# Patient Record
Sex: Female | Born: 1955 | Race: White | Hispanic: No | Smoking: Never smoker
Health system: Southern US, Community
[De-identification: ages and names within clinical notes are randomized; demographics above are authoritative.]

## PROBLEM LIST (undated history)

## (undated) DIAGNOSIS — B1920 Unspecified viral hepatitis C without hepatic coma: Secondary | ICD-10-CM

## (undated) DIAGNOSIS — G709 Myoneural disorder, unspecified: Secondary | ICD-10-CM

## (undated) DIAGNOSIS — J189 Pneumonia, unspecified organism: Secondary | ICD-10-CM

## (undated) DIAGNOSIS — F329 Major depressive disorder, single episode, unspecified: Secondary | ICD-10-CM

## (undated) DIAGNOSIS — B029 Zoster without complications: Secondary | ICD-10-CM

## (undated) DIAGNOSIS — F319 Bipolar disorder, unspecified: Secondary | ICD-10-CM

## (undated) DIAGNOSIS — K8020 Calculus of gallbladder without cholecystitis without obstruction: Secondary | ICD-10-CM

## (undated) DIAGNOSIS — F32A Depression, unspecified: Secondary | ICD-10-CM

## (undated) DIAGNOSIS — G2581 Restless legs syndrome: Secondary | ICD-10-CM

## (undated) DIAGNOSIS — Z5189 Encounter for other specified aftercare: Secondary | ICD-10-CM

## (undated) DIAGNOSIS — I10 Essential (primary) hypertension: Secondary | ICD-10-CM

## (undated) HISTORY — PX: BLADDER SURGERY: SHX569

## (undated) HISTORY — PX: COLON SURGERY: SHX602

## (undated) HISTORY — PX: ABDOMINAL HYSTERECTOMY: SHX81

## (undated) HISTORY — DX: Pneumonia, unspecified organism: J18.9

## (undated) HISTORY — PX: TUBAL LIGATION: SHX77

## (undated) HISTORY — PX: CHOLECYSTECTOMY: SHX55

## (undated) HISTORY — DX: Unspecified viral hepatitis C without hepatic coma: B19.20

## (undated) HISTORY — PX: HERNIA REPAIR: SHX51

## (undated) HISTORY — DX: Major depressive disorder, single episode, unspecified: F32.9

## (undated) HISTORY — DX: Encounter for other specified aftercare: Z51.89

## (undated) HISTORY — DX: Calculus of gallbladder without cholecystitis without obstruction: K80.20

## (undated) HISTORY — DX: Bipolar disorder, unspecified: F31.9

## (undated) HISTORY — DX: Essential (primary) hypertension: I10

## (undated) HISTORY — DX: Depression, unspecified: F32.A

## (undated) HISTORY — DX: Zoster without complications: B02.9

## (undated) HISTORY — DX: Restless legs syndrome: G25.81

## (undated) HISTORY — DX: Myoneural disorder, unspecified: G70.9

---

## 2009-08-08 DIAGNOSIS — J189 Pneumonia, unspecified organism: Secondary | ICD-10-CM

## 2009-08-08 HISTORY — DX: Pneumonia, unspecified organism: J18.9

## 2013-06-08 DIAGNOSIS — B029 Zoster without complications: Secondary | ICD-10-CM

## 2013-06-08 HISTORY — DX: Zoster without complications: B02.9

## 2013-12-09 DIAGNOSIS — I10 Essential (primary) hypertension: Secondary | ICD-10-CM | POA: Diagnosis not present

## 2013-12-09 DIAGNOSIS — R1011 Right upper quadrant pain: Secondary | ICD-10-CM | POA: Diagnosis not present

## 2013-12-09 DIAGNOSIS — F319 Bipolar disorder, unspecified: Secondary | ICD-10-CM | POA: Diagnosis not present

## 2014-01-11 DIAGNOSIS — R1011 Right upper quadrant pain: Secondary | ICD-10-CM | POA: Diagnosis not present

## 2014-01-19 DIAGNOSIS — F319 Bipolar disorder, unspecified: Secondary | ICD-10-CM | POA: Diagnosis not present

## 2014-01-19 DIAGNOSIS — I10 Essential (primary) hypertension: Secondary | ICD-10-CM | POA: Diagnosis not present

## 2014-01-19 DIAGNOSIS — E039 Hypothyroidism, unspecified: Secondary | ICD-10-CM | POA: Diagnosis not present

## 2014-02-18 DIAGNOSIS — I10 Essential (primary) hypertension: Secondary | ICD-10-CM | POA: Diagnosis not present

## 2014-02-18 DIAGNOSIS — R635 Abnormal weight gain: Secondary | ICD-10-CM | POA: Diagnosis not present

## 2014-05-29 DIAGNOSIS — R209 Unspecified disturbances of skin sensation: Secondary | ICD-10-CM | POA: Diagnosis not present

## 2014-05-29 DIAGNOSIS — J029 Acute pharyngitis, unspecified: Secondary | ICD-10-CM | POA: Diagnosis not present

## 2014-05-29 DIAGNOSIS — R109 Unspecified abdominal pain: Secondary | ICD-10-CM | POA: Diagnosis not present

## 2014-05-29 DIAGNOSIS — J189 Pneumonia, unspecified organism: Secondary | ICD-10-CM | POA: Diagnosis not present

## 2014-05-29 DIAGNOSIS — K7689 Other specified diseases of liver: Secondary | ICD-10-CM | POA: Diagnosis not present

## 2014-05-29 DIAGNOSIS — R42 Dizziness and giddiness: Secondary | ICD-10-CM | POA: Diagnosis not present

## 2014-05-29 DIAGNOSIS — R262 Difficulty in walking, not elsewhere classified: Secondary | ICD-10-CM | POA: Diagnosis not present

## 2014-05-29 DIAGNOSIS — R509 Fever, unspecified: Secondary | ICD-10-CM | POA: Diagnosis not present

## 2014-05-29 DIAGNOSIS — I10 Essential (primary) hypertension: Secondary | ICD-10-CM | POA: Diagnosis not present

## 2014-05-29 DIAGNOSIS — R5381 Other malaise: Secondary | ICD-10-CM | POA: Diagnosis not present

## 2014-05-29 DIAGNOSIS — F411 Generalized anxiety disorder: Secondary | ICD-10-CM | POA: Diagnosis not present

## 2014-05-29 DIAGNOSIS — K449 Diaphragmatic hernia without obstruction or gangrene: Secondary | ICD-10-CM | POA: Diagnosis not present

## 2014-05-29 DIAGNOSIS — R079 Chest pain, unspecified: Secondary | ICD-10-CM | POA: Diagnosis not present

## 2014-05-29 DIAGNOSIS — R51 Headache: Secondary | ICD-10-CM | POA: Diagnosis not present

## 2014-05-29 DIAGNOSIS — R300 Dysuria: Secondary | ICD-10-CM | POA: Diagnosis not present

## 2014-05-29 DIAGNOSIS — R042 Hemoptysis: Secondary | ICD-10-CM | POA: Diagnosis not present

## 2014-05-29 DIAGNOSIS — R0602 Shortness of breath: Secondary | ICD-10-CM | POA: Diagnosis not present

## 2014-08-11 DIAGNOSIS — F319 Bipolar disorder, unspecified: Secondary | ICD-10-CM | POA: Diagnosis not present

## 2014-08-11 DIAGNOSIS — I10 Essential (primary) hypertension: Secondary | ICD-10-CM | POA: Diagnosis not present

## 2014-08-11 DIAGNOSIS — R635 Abnormal weight gain: Secondary | ICD-10-CM | POA: Diagnosis not present

## 2014-08-11 DIAGNOSIS — M6080 Other myositis, unspecified site: Secondary | ICD-10-CM | POA: Diagnosis not present

## 2014-09-20 DIAGNOSIS — I10 Essential (primary) hypertension: Secondary | ICD-10-CM | POA: Diagnosis not present

## 2014-09-20 DIAGNOSIS — F319 Bipolar disorder, unspecified: Secondary | ICD-10-CM | POA: Diagnosis not present

## 2014-12-28 DIAGNOSIS — I10 Essential (primary) hypertension: Secondary | ICD-10-CM | POA: Diagnosis not present

## 2014-12-28 DIAGNOSIS — F319 Bipolar disorder, unspecified: Secondary | ICD-10-CM | POA: Diagnosis not present

## 2015-03-21 DIAGNOSIS — F319 Bipolar disorder, unspecified: Secondary | ICD-10-CM | POA: Diagnosis not present

## 2015-03-21 DIAGNOSIS — R079 Chest pain, unspecified: Secondary | ICD-10-CM | POA: Diagnosis not present

## 2015-08-01 ENCOUNTER — Ambulatory Visit (INDEPENDENT_AMBULATORY_CARE_PROVIDER_SITE_OTHER): Payer: Medicare Other | Admitting: Physician Assistant

## 2015-08-01 ENCOUNTER — Ambulatory Visit (INDEPENDENT_AMBULATORY_CARE_PROVIDER_SITE_OTHER): Payer: Medicare Other

## 2015-08-01 VITALS — BP 122/80 | HR 72 | Temp 98.6°F | Resp 18 | Ht 62.0 in | Wt 217.8 lb

## 2015-08-01 DIAGNOSIS — Z23 Encounter for immunization: Secondary | ICD-10-CM

## 2015-08-01 DIAGNOSIS — R1032 Left lower quadrant pain: Secondary | ICD-10-CM

## 2015-08-01 LAB — POCT URINALYSIS DIP (MANUAL ENTRY)
Bilirubin, UA: NEGATIVE
Blood, UA: NEGATIVE
Glucose, UA: NEGATIVE
Ketones, POC UA: NEGATIVE
LEUKOCYTES UA: NEGATIVE
Nitrite, UA: NEGATIVE
PROTEIN UA: NEGATIVE
SPEC GRAV UA: 1.025
UROBILINOGEN UA: 0.2
pH, UA: 5

## 2015-08-01 LAB — POCT CBC
Granulocyte percent: 62.3 %G (ref 37–80)
HCT, POC: 40.9 % (ref 37.7–47.9)
Hemoglobin: 13.8 g/dL (ref 12.2–16.2)
LYMPH, POC: 2.3 (ref 0.6–3.4)
MCH, POC: 29.2 pg (ref 27–31.2)
MCHC: 33.7 g/dL (ref 31.8–35.4)
MCV: 86.8 fL (ref 80–97)
MID (cbc): 0.4 (ref 0–0.9)
MPV: 7.9 fL (ref 0–99.8)
PLATELET COUNT, POC: 271 10*3/uL (ref 142–424)
POC Granulocyte: 4.5 (ref 2–6.9)
POC LYMPH %: 31.7 % (ref 10–50)
POC MID %: 6 %M (ref 0–12)
RBC: 4.71 M/uL (ref 4.04–5.48)
RDW, POC: 13.8 %
WBC: 7.2 10*3/uL (ref 4.6–10.2)

## 2015-08-01 LAB — POC MICROSCOPIC URINALYSIS (UMFC)

## 2015-08-01 NOTE — Progress Notes (Signed)
Patient ID: Catherine Farrell, female    DOB: 13-Oct-1955, 58 y.o.   MRN: [id number]  PCP: Pcp Not In System  Subjective:   Chief Complaint  Patient presents with  . Groin Pain    pain started about 2 weeks after sexual intercourse. on left side.     HPI Presents for evaluation of LLQ abdominal pain x 2 weeks.   The pain started during intercourse on October 15th, prior to which time she had not been sexually active for 9 months. The pain is sharp and stabbing in nature and initially subsided several hours after intercourse with aspirin and a heating pad. The pain returned 2-3 days later and has gradually worsened since that time, particularly with straining.   Yesterday, she remained in bed all day d/t dizziness and nausea.   She endorses some increased urinary frequency and urgency, but denies hematuria, vaginal discharge, itching, odor, fevers, chills, vomiting, or diarrhea.   She reports recently starting a new diet plan to lose weight and has had some constipation as a result. She has tried to increase her water intake, however she has not had a BM in 4 days.   Of note, she had a partial hysterectomy in early 2000 for fibroids. She is post-menopausal x 5 years.   No other prior abdominal surgeries.   Patient moved to this area 3 months ago and is looking to establish care with a new PCP. She would like to get her flu vaccination today as well.   No other concerns on today's visit.     Review of Systems Constitutional: Positive for appetite change (not as hungry). Negative for fever, chills and fatigue.  Gastrointestinal: Positive for nausea, abdominal pain (LLQ) and constipation. Negative for vomiting, diarrhea and blood in stool.  Genitourinary: Positive for urgency, frequency and dyspareunia. Negative for dysuria, hematuria, flank pain, decreased urine volume, vaginal bleeding, vaginal discharge, difficulty urinating, vaginal pain and pelvic pain.  Musculoskeletal:  Negative for back pain.  Neurological: Positive for dizziness. Negative for headaches.      There are no active problems to display for this patient.    Prior to Admission medications   Medication Sig Start Date End Date Taking? Authorizing Provider  amLODipine (NORVASC) 10 MG tablet Take 10 mg by mouth daily.   Yes Historical Provider, MD  cyclobenzaprine (FLEXERIL) 10 MG tablet Take 10 mg by mouth 3 (three) times daily as needed for muscle spasms.   Yes Historical Provider, MD  diazepam (VALIUM) 10 MG tablet Take 10 mg by mouth every 6 (six) hours as needed for anxiety.   Yes Historical Provider, MD  lisinopril-hydrochlorothiazide (PRINZIDE,ZESTORETIC) 10-12.5 MG tablet Take 1 tablet by mouth daily.   Yes Historical Provider, MD  metoprolol succinate (TOPROL-XL) 100 MG 24 hr tablet Take 100 mg by mouth daily. Take with or immediately following a meal.   Yes Historical Provider, MD  QUEtiapine (SEROQUEL) 200 MG tablet Take 200 mg by mouth at bedtime.   Yes Historical Provider, MD     No Known Allergies     Objective:  Physical Exam  Constitutional: She is oriented to person, place, and time. Vital signs are normal. She appears well-developed and well-nourished. She is active and cooperative. No distress.  BP 122/80 mmHg  Pulse 72  Temp(Src) 98.6 F (37 C) (Oral)  Resp 18  Ht  (1.575 m)  Wt 217 lb 12.8 oz (98.793 kg)  BMI 39.83 kg/m2  SpO2 97%  HENT:  Head: Normocephalic and  atraumatic.  Right Ear: Hearing normal.  Left Ear: Hearing normal.  Eyes: Conjunctivae are normal. No scleral icterus.  Neck: Normal range of motion. Neck supple. No thyromegaly present.  Cardiovascular: Normal rate, regular rhythm and normal heart sounds.   Pulses:      Radial pulses are 2+ on the right side, and 2+ on the left side.  Pulmonary/Chest: Effort normal and breath sounds normal.  Abdominal: Soft. Normal appearance and bowel sounds are normal. She exhibits no shifting dullness, no  distension, no pulsatile liver, no fluid wave, no abdominal bruit, no ascites, no pulsatile midline mass and no mass. There is no hepatosplenomegaly. There is tenderness (mild) in the left lower quadrant. There is no rigidity, no rebound, no guarding, no CVA tenderness, no tenderness at McBurney's point and negative Murphy's sign. No hernia. Hernia confirmed negative in the right inguinal area and confirmed negative in the left inguinal area.  Genitourinary: Vagina normal. Pelvic exam was performed with patient supine. No labial fusion. There is no rash, tenderness, lesion or injury on the right labia. There is no rash, tenderness, lesion or injury on the left labia. Right adnexum displays no mass, no tenderness and no fullness. Left adnexum displays no mass, no tenderness and no fullness.  Cervix is surgically absent.  Lymphadenopathy:       Head (right side): No tonsillar, no preauricular, no posterior auricular and no occipital adenopathy present.       Head (left side): No tonsillar, no preauricular, no posterior auricular and no occipital adenopathy present.    She has no cervical adenopathy.       Right: No inguinal and no supraclavicular adenopathy present.       Left: No inguinal and no supraclavicular adenopathy present.  Neurological: She is alert and oriented to person, place, and time. No sensory deficit.  Skin: Skin is warm, dry and intact. No rash noted. No cyanosis or erythema. Nails show no clubbing.  Psychiatric: She has a normal mood and affect.       Acute Abdominal Series: [facility] reading (PRIMARY) by  Dr. Perrin MalteseGuest. Non-specific bowel gas pattern, with large stool burden.No Free air, ileus, mass.  Results for orders placed or performed in visit on 08/01/15  POCT CBC  Result Value Ref Range   WBC 7.2 4.6 - 10.2 K/uL   Lymph, poc 2.3 0.6 - 3.4   POC LYMPH PERCENT 31.7 10 - 50 %L   MID (cbc) 0.4 0 - 0.9   POC MID % 6.0 0 - 12 %M   POC Granulocyte 4.5 2 - 6.9   Granulocyte  percent 62.3 37 - 80 %G   RBC 4.71 4.04 - 5.48 M/uL   Hemoglobin 13.8 12.2 - 16.2 g/dL   HCT, POC 16.140.9 09.637.7 - 47.9 %   MCV 86.8 80 - 97 fL   MCH, POC 29.2 27 - 31.2 pg   MCHC 33.7 31.8 - 35.4 g/dL   RDW, POC 04.513.8 %   Platelet Count, POC 271 142 - 424 K/uL   MPV 7.9 0 - 99.8 fL  POCT urinalysis dipstick  Result Value Ref Range   Color, UA yellow yellow   Clarity, UA clear clear   Glucose, UA negative negative   Bilirubin, UA negative negative   Ketones, POC UA negative negative   Spec Grav, UA 1.025    Blood, UA negative negative   pH, UA 5.0    Protein Ur, POC negative negative   Urobilinogen, UA 0.2  Nitrite, UA Negative Negative   Leukocytes, UA Negative Negative  POCT Microscopic Urinalysis ([facility])  Result Value Ref Range   WBC,UR,HPF,POC None None WBC/hpf   RBC,UR,HPF,POC None None RBC/hpf   Bacteria None None, Too numerous to count   Mucus Present (A) Absent   Epithelial Cells, UR Per Microscopy Moderate (A) None, Too numerous to count cells/hpf   Casts hyaline          Assessment & Plan:   1. LLQ abdominal pain Suspect constipation. No evidence of infection. Increase fluids, fiber, physical activity. Miralax OTC. RTC if symptoms worsen/persist. - POCT CBC - POCT urinalysis dipstick - POCT Microscopic Urinalysis ([facility]) - DG Abd Acute W/Chest  2. Need for influenza vaccination - Flu Vaccine QUAD 36+ mos IM   Fernande Bras, PA-C Physician Assistant-Certified [facility]

## 2015-08-01 NOTE — Patient Instructions (Signed)
I suspect that the stool in the colon os the cause of your pain. Be sure you are drinking 64 ounces of water daily and getting plenty of fiber in your diet.  Use OTC Miralax 1-2 times daily, until you have good results and your pain resolves. If you develop loose stools, reduce the frequency of Miralax. If your pain worsens or does not improve, return for additional evaluation.

## 2015-08-01 NOTE — Progress Notes (Signed)
Subjective:    Patient ID: Catherine Farrell, female    DOB: 08/08/1956, 59 y.o.   MRN: [id number]  Chief Complaint  Patient presents with  . Groin Pain    pain started about 2 weeks after sexual intercourse. on left side.    HPI Patient presents today for evaluation of LLQ abdominal pain x 2 weeks. The pain started with intercourse on October 15th, prior to which time she had not been sexually active for 9 months. The pain is sharp and stabbing in nature and initially subsided several hours after intercourse with aspirin and a heating pad. The pain returned 2-3 days later and has gradually worsened since that time, particularly with straining. Yesterday, she remained in bed all day d/t dizziness and nausea. She endorses some increased urinary frequency and urgency, but denies hematuria, vaginal discharge, itching, odor, fevers, chills, vomiting, or diarrhea.   She reports recently starting a new diet plan to lose weight and has had some constipation as a result. She has tried to increase her water intake, however she has not had a BM in 4 days.    Of note, she had a partial hysterectomy in early 2000 for fibroids. She is post-menopausal x 5 years. No other prior abdominal surgeries.   Patient moved to this area 3 months ago and is looking to establish care with a new PCP. She would like to get her flu vaccination today as well.    No other concerns on today's visit.   Review of Systems  Constitutional: Positive for appetite change (not as hungry). Negative for fever, chills and fatigue.  Gastrointestinal: Positive for nausea, abdominal pain (LLQ) and constipation. Negative for vomiting, diarrhea and blood in stool.  Genitourinary: Positive for urgency, frequency and dyspareunia. Negative for dysuria, hematuria, flank pain, decreased urine volume, vaginal bleeding, vaginal discharge, difficulty urinating, vaginal pain and pelvic pain.  Musculoskeletal: Negative for back pain.  Neurological:  Positive for dizziness. Negative for headaches.   There are no active problems to display for this patient.  Family History  Problem Relation Age of Onset  . Hyperlipidemia Mother   . Hypertension Mother   . Mental illness Mother   . Hyperlipidemia Father   . Mental illness Father   . Hyperlipidemia Sister   . Mental illness Sister   . Diabetes Sister   . Hyperlipidemia Brother   . Mental illness Brother   . Mental illness Brother   . Hyperlipidemia Brother    Social History   Social History  . Marital Status: Widowed    Spouse Name: N/A  . Number of Children: N/A  . Years of Education: N/A   Occupational History  . Not on file.   Social History Main Topics  . Smoking status: Never Smoker   . Smokeless tobacco: Not on file  . Alcohol Use: No  . Drug Use: No  . Sexual Activity: Not on file   Other Topics Concern  . Not on file   Social History Narrative  . No narrative on file   Prior to Admission medications   Medication Sig Start Date End Date Taking? Authorizing Provider  amLODipine (NORVASC) 10 MG tablet Take 10 mg by mouth daily.   Yes Historical Provider, MD  cyclobenzaprine (FLEXERIL) 10 MG tablet Take 10 mg by mouth 3 (three) times daily as needed for muscle spasms.   Yes Historical Provider, MD  diazepam (VALIUM) 10 MG tablet Take 10 mg by mouth every 6 (six) hours as needed for  anxiety.   Yes Historical Provider, MD  lisinopril-hydrochlorothiazide (PRINZIDE,ZESTORETIC) 10-12.5 MG tablet Take 1 tablet by mouth daily.   Yes Historical Provider, MD  metoprolol succinate (TOPROL-XL) 100 MG 24 hr tablet Take 100 mg by mouth daily. Take with or immediately following a meal.   Yes Historical Provider, MD  QUEtiapine (SEROQUEL) 200 MG tablet Take 200 mg by mouth at bedtime.   Yes Historical Provider, MD   No Known Allergies    Objective:   Physical Exam  Constitutional: She is oriented to person, place, and time. She appears well-developed and well-nourished.  No distress.  BP 122/80 mmHg  Pulse 72  Temp(Src) 98.6 F (37 C) (Oral)  Resp 18  Ht  (1.575 m)  Wt 217 lb 12.8 oz (98.793 kg)  BMI 39.83 kg/m2  SpO2 97%  HENT:  Head: Normocephalic and atraumatic.  Eyes: EOM are normal. No scleral icterus.  Neck: Neck supple. No JVD present. No thyromegaly present.  Cardiovascular: Normal rate, regular rhythm, normal heart sounds and intact distal pulses.  Exam reveals no gallop and no friction rub.   No murmur heard. Pulmonary/Chest: Effort normal and breath sounds normal. No respiratory distress. She has no wheezes. She exhibits no tenderness.  Abdominal: Soft. Bowel sounds are normal. She exhibits no distension and no mass. There is tenderness (LLQ, mild). There is no rebound and no guarding.  Genitourinary: Vagina normal and uterus normal. No vaginal discharge found.  No cervix, consistent with partial hysterectomy. No adnexal fullness or tenderness. No blood or discharge. No masses appreciated.   Lymphadenopathy:    She has no cervical adenopathy.  Neurological: She is alert and oriented to person, place, and time.  Skin: Skin is warm and dry. No rash noted. She is not diaphoretic. No erythema.  Psychiatric: She has a normal mood and affect. Her behavior is normal. Judgment and thought content normal.   Results for orders placed or performed in visit on 08/01/15  POCT CBC  Result Value Ref Range   WBC 7.2 4.6 - 10.2 K/uL   Lymph, poc 2.3 0.6 - 3.4   POC LYMPH PERCENT 31.7 10 - 50 %L   MID (cbc) 0.4 0 - 0.9   POC MID % 6.0 0 - 12 %M   POC Granulocyte 4.5 2 - 6.9   Granulocyte percent 62.3 37 - 80 %G   RBC 4.71 4.04 - 5.48 M/uL   Hemoglobin 13.8 12.2 - 16.2 g/dL   HCT, POC 16.1 09.6 - 47.9 %   MCV 86.8 80 - 97 fL   MCH, POC 29.2 27 - 31.2 pg   MCHC 33.7 31.8 - 35.4 g/dL   RDW, POC 04.5 %   Platelet Count, POC 271 142 - 424 K/uL   MPV 7.9 0 - 99.8 fL  POCT urinalysis dipstick  Result Value Ref Range   Color, UA yellow yellow    Clarity, UA clear clear   Glucose, UA negative negative   Bilirubin, UA negative negative   Ketones, POC UA negative negative   Spec Grav, UA 1.025    Blood, UA negative negative   pH, UA 5.0    Protein Ur, POC negative negative   Urobilinogen, UA 0.2    Nitrite, UA Negative Negative   Leukocytes, UA Negative Negative  POCT Microscopic Urinalysis ([facility])  Result Value Ref Range   WBC,UR,HPF,POC None None WBC/hpf   RBC,UR,HPF,POC None None RBC/hpf   Bacteria None None, Too numerous to count   Mucus Present (A)  Absent   Epithelial Cells, UR Per Microscopy Moderate (A) None, Too numerous to count cells/hpf   Casts hyaline    Abd w/ Chest XR shows no free air, calcifications, or bony abnormalities. Heart and lungs appear normal. Significant stool burden and gas noted throughout the colon consistent with constipation.     Assessment & Plan:  1. LLQ abdominal pain - Normal labs and imaging studies suggest no infectious or structural etiology. Suspect this is d/t colon distention/stretching secondary to constipation. Encourage patient to drink plenty of fluids, take OTC Miralax 1-2x/day, exercise, and consume plenty of fiber to help facilitate bowel movements.  - If no improvement of symptoms in the next 7 days, return to clinic for re-evaluation. - POCT CBC - POCT urinalysis dipstick - POCT Microscopic Urinalysis ([facility]) - DG Abd Acute W/Chest  2. Need for influenza vaccination - Flu Vaccine QUAD 36+ mos IM

## 2015-08-03 ENCOUNTER — Encounter: Payer: Self-pay | Admitting: Physician Assistant

## 2015-09-22 ENCOUNTER — Other Ambulatory Visit: Payer: Self-pay

## 2015-09-22 ENCOUNTER — Ambulatory Visit (INDEPENDENT_AMBULATORY_CARE_PROVIDER_SITE_OTHER): Payer: Medicare Other | Admitting: Physician Assistant

## 2015-09-22 VITALS — BP 148/90 | HR 63 | Temp 98.6°F | Resp 18 | Ht 62.0 in | Wt 219.0 lb

## 2015-09-22 DIAGNOSIS — R1011 Right upper quadrant pain: Secondary | ICD-10-CM

## 2015-09-22 DIAGNOSIS — G8929 Other chronic pain: Secondary | ICD-10-CM | POA: Diagnosis not present

## 2015-09-22 DIAGNOSIS — G709 Myoneural disorder, unspecified: Secondary | ICD-10-CM | POA: Diagnosis not present

## 2015-09-22 DIAGNOSIS — R1013 Epigastric pain: Secondary | ICD-10-CM

## 2015-09-22 DIAGNOSIS — R110 Nausea: Secondary | ICD-10-CM

## 2015-09-22 DIAGNOSIS — B182 Chronic viral hepatitis C: Secondary | ICD-10-CM

## 2015-09-22 DIAGNOSIS — H9201 Otalgia, right ear: Secondary | ICD-10-CM

## 2015-09-22 DIAGNOSIS — F319 Bipolar disorder, unspecified: Secondary | ICD-10-CM

## 2015-09-22 DIAGNOSIS — I10 Essential (primary) hypertension: Secondary | ICD-10-CM | POA: Diagnosis not present

## 2015-09-22 DIAGNOSIS — G2581 Restless legs syndrome: Secondary | ICD-10-CM | POA: Diagnosis not present

## 2015-09-22 DIAGNOSIS — G509 Disorder of trigeminal nerve, unspecified: Secondary | ICD-10-CM | POA: Insufficient documentation

## 2015-09-22 DIAGNOSIS — Z1231 Encounter for screening mammogram for malignant neoplasm of breast: Secondary | ICD-10-CM | POA: Diagnosis not present

## 2015-09-22 MED ORDER — ESOMEPRAZOLE MAGNESIUM 40 MG PO PACK: 40.0000 mg | PACK | Freq: Every day | ORAL | Status: DC

## 2015-09-22 MED ORDER — DIAZEPAM 10 MG PO TABS: 10.0000 mg | ORAL_TABLET | Freq: Four times a day (QID) | ORAL | Status: DC | PRN

## 2015-09-22 MED ORDER — CYCLOBENZAPRINE HCL 10 MG PO TABS: 10.0000 mg | ORAL_TABLET | Freq: Three times a day (TID) | ORAL | Status: DC | PRN

## 2015-09-22 MED ORDER — METOPROLOL TARTRATE 100 MG PO TABS: 100.0000 mg | ORAL_TABLET | Freq: Two times a day (BID) | ORAL | Status: DC

## 2015-09-22 NOTE — Progress Notes (Signed)
Patient ID: Catherine StallionCarolyn Farrell, female    DOB: 03/24/1956, 59 y.o.   MRN: [id number]  PCP: Pcp Not In System  Subjective:   Chief Complaint  Patient presents with  . Medication Refill    diazepam, metoprol, flexeril  . Abdominal Pain    has hx gallbladder issues   . Emesis  . Depression    see screen    HPI Presents for evaluation of chronic epigastric abdominal pain and nausea, and needing medication refills.  Needs to establish for primary care as she has moved back to [place].  Previously followed by Dr. Farris HasKramer at [facility] in [place], [place]. She was seeing a therapist, but all her prescriptions came from her PCP. Both parents and all her siblings have bipolar disorder, and she is the only one in her immediate family that was never institutionalized for care. Likes Seroquel, sleeps well. Desires to continue it.  Depression is chronic. Previously attended therapy, multiple times. "I think I've just been dealing with it myself. I read a lot, the Bible. I'm doing ok. I'm dealing with it. I've got my daughter, my grandaughter, son-in-law. I get out and go." No thoughts of harming herself or others.  She has a long history of RUQ and epigastric abdominal pain. Nexium worked well in the past, but she hasn't been on a PPI in a number of months. Was getting her pills free, but that program stopped. "Nexiums are really expensive. I have to pay for my prescription drugs. Needless to say, I'm on a tight budget."  Dr. Farris HasKramer told her she" wasn't getting enough blood from her gallbladder to her heart."  US 2 years ago was negative for GB disease. "I get sick all the time." "I have heartburn really really bad." Bakes/broils, no sweets, no bread.  Some foods, like Pizza, "2 slices, my food wants to come back up." Even when she eats something light she experiences dyspepsia. Even when she is upright after eating, but it's worse if she eats late and close to  bedtime.Marland Kitchen.   RIGHT ear pain x several weeks. "Some mornings I get up and I'm real dizzy." No syncope or falls. Not particularly congested. No coughing. No fever or chills. "I'm not sure if I have an infection, or sinus. Sometimes it hurts up here" (points tot he RIGHT forehead)   [facility] 09/22/2015 08/01/2015  Decreased Interest 2 1  Down, Depressed, Hopeless 1 1  PHQ - 2 Score 3 2  Altered sleeping 0 -  Tired, decreased energy 2 -  Change in appetite 3 -  Feeling bad or failure about yourself  1 -  Trouble concentrating 0 -  Moving slowly or fidgety/restless 0 -  Suicidal thoughts 0 -  PHQ-9 Score 9 -  Difficult doing work/chores Somewhat difficult -      Review of Systems As above.    Patient Active Problem List   Diagnosis Date Noted  . Bipolar 1 disorder (HCC) 09/22/2015  . HTN (hypertension) 09/22/2015  . RLS (restless legs syndrome) 09/22/2015  . Chronic hepatitis C (HCC) 09/22/2015  . Neuromuscular disorder (HCC) 09/22/2015     Prior to Admission medications   Medication Sig Start Date End Date Taking? Authorizing Provider  amLODipine (NORVASC) 10 MG tablet Take 10 mg by mouth daily.   Yes Historical Provider, MD  aspirin 81 MG tablet Take 81 mg by mouth daily.   Yes Historical Provider, MD  cyclobenzaprine (FLEXERIL) 10 MG tablet Take 10  mg by mouth 3 (three) times daily as needed for muscle spasms.   Yes Historical Provider, MD  diazepam (VALIUM) 10 MG tablet Take 10 mg by mouth every 6 (six) hours as needed for anxiety.   Yes Historical Provider, MD  lisinopril-hydrochlorothiazide (PRINZIDE,ZESTORETIC) 10-12.5 MG tablet Take 1 tablet by mouth daily.   Yes Historical Provider, MD  metoprolol (LOPRESSOR) 100 MG tablet Take 100 mg by mouth 2 (two) times daily.   Yes Historical Provider, MD  Omega-3 Fatty Acids (OMEGA-3 FISH OIL PO) Take by mouth.   Yes Historical Provider, MD  Probiotic Product (PROBIOTIC DAILY PO) Take by mouth.   Yes  Historical Provider, MD  QUEtiapine (SEROQUEL) 200 MG tablet Take 200 mg by mouth at bedtime.   Yes Historical Provider, MD  spironolactone (ALDACTONE) 25 MG tablet Take 25 mg by mouth daily.   Yes Historical Provider, MD  metoprolol succinate (TOPROL-XL) 100 MG 24 hr tablet Take 100 mg by mouth daily. Reported on 09/22/2015    Historical Provider, MD     No Known Allergies     Objective:  Physical Exam  Constitutional: She is oriented to person, place, and time. She appears well-developed and well-nourished. She is active and cooperative. No distress.  BP 148/90 mmHg  Pulse 63  Temp(Src) 98.6 F (37 C) (Oral)  Resp 18  Ht  (1.575 m)  Wt 219 lb (99.338 kg)  BMI 40.05 kg/m2  SpO2 98%  HENT:  Head: Normocephalic and atraumatic.  Right Ear: Hearing, tympanic membrane, external ear and ear canal normal.  Left Ear: Hearing, tympanic membrane, external ear and ear canal normal.  Nose: Nose normal.  Mouth/Throat: Uvula is midline, oropharynx is clear and moist and mucous membranes are normal. No oral lesions.  Eyes: Conjunctivae and EOM are normal. Pupils are equal, round, and reactive to light. No scleral icterus.  Neck: Normal range of motion and phonation normal. Neck supple. No thyromegaly present.  Cardiovascular: Normal rate, regular rhythm and normal heart sounds.   Pulses:      Radial pulses are 2+ on the right side, and 2+ on the left side.  Pulmonary/Chest: Effort normal and breath sounds normal.  Abdominal: Soft. Normal appearance and bowel sounds are normal. There is no hepatosplenomegaly. There is tenderness in the right upper quadrant and epigastric area. There is no CVA tenderness.  Lymphadenopathy:       Head (right side): No tonsillar, no preauricular, no posterior auricular and no occipital adenopathy present.       Head (left side): No tonsillar, no preauricular, no posterior auricular and no occipital adenopathy present.    She has no cervical adenopathy.        Right: No supraclavicular adenopathy present.       Left: No supraclavicular adenopathy present.  Neurological: She is alert and oriented to person, place, and time. She has normal strength. No sensory deficit.  Skin: Skin is warm, dry and intact. No rash noted. No cyanosis or erythema. Nails show no clubbing.  Psychiatric: She has a normal mood and affect. Her speech is normal and behavior is normal. Thought content is not paranoid and not delusional. She expresses no homicidal and no suicidal ideation.           Assessment & Plan:   1. Nausea without vomiting 2. Abdominal pain, chronic, epigastric 3. RUQ abdominal pain Likely GERD. Restart PPI therapy. Update imaging of the GB. Will get records from her previous PCP. - US Abdomen Limited RUQ;  Future - esomeprazole (NEXIUM) 40 MG packet; Take 40 mg by mouth daily before breakfast.  Dispense: 30 each; Refill: 12   4. Ear pain, right Normal ear exam. Since her symptoms are intermittent, she'll try NSAIDS and OTC oral antihistamines for now. Plan additional evaluation if symptoms worsen or persist.  5. Bipolar 1 disorder (HCC) Controlled on seroquel and diazepam. - diazepam (VALIUM) 10 MG tablet; Take 1 tablet (10 mg total) by mouth every 6 (six) hours as needed for anxiety.  Dispense: 30 tablet; Refill: 0  6. Essential hypertension Above goal today. On 4 agents. Continue current regimen and recheck at follow-up after review of her previous records.  - metoprolol (LOPRESSOR) 100 MG tablet; Take 1 tablet (100 mg total) by mouth 2 (two) times daily.  Dispense: 60 tablet; Refill: 3  7. RLS (restless legs syndrome) asymtpmatic.  8. Chronic hepatitis C without hepatic coma (HCC) S/P treatment. Await previous records.  9. Neuromuscular disorder (HCC) She isn't able to provide any additional details about this diagnosis. Await previous records. - cyclobenzaprine (FLEXERIL) 10 MG tablet; Take 1 tablet (10 mg total) by mouth 3 (three)  times daily as needed for muscle spasms.  Dispense: 30 tablet; Refill: 3  10. Visit for screening mammogram - MM Digital Screening; Future    Return in about 3 months (around 12/21/2015). At that visit we will update her outstanding health maintenance items, as I will have reviewed her previous records.   Fernande Bras, PA-C Physician Assistant-Certified [facility]

## 2015-09-22 NOTE — Telephone Encounter (Signed)
Pt stated she would like diazepam to be 90 qty instead of 30 because that will only last her 2 weeks. She wanted Chelle to change this before she leaves and returns on the 26th.  Please advise  [phone]

## 2015-09-23 MED ORDER — DIAZEPAM 10 MG PO TABS: 10.0000 mg | ORAL_TABLET | Freq: Four times a day (QID) | ORAL | Status: DC | PRN

## 2015-09-23 NOTE — Telephone Encounter (Signed)
If she has already filled the Rx I gave her for #30, She can have #30 additional tabs to be filled in 2 weeks.  If she has NOT already filled it, she can ask the pharmacist to destroy it, and we can send in #60.  I can prescribe a legitimate 30-day supply. #90 appears to be a 6 week supply.

## 2015-09-23 NOTE — Telephone Encounter (Signed)
Meds ordered this encounter  Medications  . diazepam (VALIUM) 10 MG tablet    Sig: Take 1 tablet (10 mg total) by mouth every 6 (six) hours as needed for anxiety.    Dispense:  30 tablet    Refill:  0    May fill 2 weeks after date on prescription

## 2015-09-23 NOTE — Telephone Encounter (Signed)
Spoke with pt, #60 is fine and we can send in 30 more. She got her Rx filled for #30.

## 2015-09-25 NOTE — Telephone Encounter (Signed)
LMOM on machine that rx has been called into pharmacy.

## 2015-09-30 ENCOUNTER — Ambulatory Visit
Admission: RE | Admit: 2015-09-30 | Discharge: 2015-09-30 | Disposition: A | Discharge status: Home / Self Care | Payer: Medicare Other | Source: Ambulatory Visit | Attending: Physician Assistant | Admitting: Physician Assistant

## 2015-09-30 DIAGNOSIS — G8929 Other chronic pain: Secondary | ICD-10-CM

## 2015-09-30 DIAGNOSIS — K8020 Calculus of gallbladder without cholecystitis without obstruction: Secondary | ICD-10-CM

## 2015-09-30 DIAGNOSIS — R112 Nausea with vomiting, unspecified: Secondary | ICD-10-CM | POA: Diagnosis not present

## 2015-09-30 DIAGNOSIS — R1013 Epigastric pain: Secondary | ICD-10-CM

## 2015-09-30 DIAGNOSIS — R1011 Right upper quadrant pain: Secondary | ICD-10-CM | POA: Diagnosis not present

## 2015-09-30 DIAGNOSIS — R110 Nausea: Secondary | ICD-10-CM

## 2015-10-03 DIAGNOSIS — K8020 Calculus of gallbladder without cholecystitis without obstruction: Secondary | ICD-10-CM

## 2015-10-03 HISTORY — DX: Calculus of gallbladder without cholecystitis without obstruction: K80.20

## 2015-10-04 ENCOUNTER — Telehealth: Payer: Self-pay

## 2015-10-04 ENCOUNTER — Encounter: Payer: Self-pay | Admitting: Physician Assistant

## 2015-10-04 DIAGNOSIS — K8020 Calculus of gallbladder without cholecystitis without obstruction: Secondary | ICD-10-CM

## 2015-10-04 NOTE — Telephone Encounter (Signed)
Patient was advised to call if she wants a referral. Patient's gall bladder if full of gall stones and she needs a referral for surgery for somewhere in the [place]. [phone].

## 2015-10-04 NOTE — Telephone Encounter (Signed)
Orders Placed This Encounter  Procedures  . Ambulatory referral to General Surgery

## 2015-10-05 NOTE — Telephone Encounter (Signed)
Left message- referral placed.  

## 2015-10-06 ENCOUNTER — Other Ambulatory Visit: Payer: Self-pay | Admitting: Physician Assistant

## 2015-10-20 ENCOUNTER — Other Ambulatory Visit: Payer: Self-pay | Admitting: Physician Assistant

## 2015-10-20 DIAGNOSIS — F419 Anxiety disorder, unspecified: Secondary | ICD-10-CM

## 2015-10-21 ENCOUNTER — Other Ambulatory Visit: Payer: Self-pay | Admitting: Physician Assistant

## 2015-10-21 DIAGNOSIS — F319 Bipolar disorder, unspecified: Secondary | ICD-10-CM

## 2015-10-21 NOTE — Addendum Note (Signed)
Addended by: Fernande BrasJEFFERY, Kadar Chance S on: 10/21/2015 08:10 PM   Modules accepted: Orders

## 2015-10-21 NOTE — Telephone Encounter (Signed)
Meds ordered this encounter  Medications  . diazepam (VALIUM) 10 MG tablet    Sig: TAKE ONE TABLET BY MOUTH EVERY SIX HOURS AS NEEDED FOR ANXIETY    Dispense:  30 tablet    Refill:  0   Please clarify how often patient is taking this. Looks like she's taking it BID. If that is the case, I wonder if we should change the Rx to dispense #60?

## 2015-10-23 ENCOUNTER — Telehealth: Payer: Self-pay

## 2015-10-23 NOTE — Telephone Encounter (Signed)
Pt wants to know why Chelle denied meds diazepam (VALIUM) 10 MG tablet   Please have Chelle to contact her she wants to talk to her personally.  [phone]

## 2015-10-24 ENCOUNTER — Other Ambulatory Visit: Payer: Self-pay | Admitting: Physician Assistant

## 2015-10-24 ENCOUNTER — Other Ambulatory Visit: Payer: Self-pay | Admitting: Surgery

## 2015-10-24 DIAGNOSIS — F419 Anxiety disorder, unspecified: Secondary | ICD-10-CM

## 2015-10-24 DIAGNOSIS — K5909 Other constipation: Secondary | ICD-10-CM | POA: Diagnosis not present

## 2015-10-24 DIAGNOSIS — K8010 Calculus of gallbladder with chronic cholecystitis without obstruction: Secondary | ICD-10-CM | POA: Diagnosis not present

## 2015-10-24 DIAGNOSIS — B1920 Unspecified viral hepatitis C without hepatic coma: Secondary | ICD-10-CM | POA: Diagnosis not present

## 2015-10-24 MED ORDER — DIAZEPAM 10 MG PO TABS: ORAL_TABLET | ORAL | Status: DC

## 2015-10-24 NOTE — Telephone Encounter (Signed)
Called in for #60. Notified pt.

## 2015-10-24 NOTE — Telephone Encounter (Signed)
Porfirio Oarhelle Jeffery, PA-C at 10/21/2015 4:20 PM     Status: Signed       Expand All Collapse All   Meds ordered this encounter  Medications  . diazepam (VALIUM) 10 MG tablet    Sig: TAKE ONE TABLET BY MOUTH EVERY SIX HOURS AS NEEDED FOR ANXIETY    Dispense: 30 tablet    Refill: 0   Please clarify how often patient is taking this. Looks like she's taking it BID. If that is the case, I wonder if we should change the Rx to dispense #60?

## 2015-10-24 NOTE — Telephone Encounter (Signed)
Left message to clarify dose

## 2015-10-24 NOTE — Telephone Encounter (Signed)
Spoke to pt who stated that she does take it twice a day, once in morning and once in afternoon. She has severe panic attacks, and is OK as long as she takes twice daily. Pt stated she would really appreciate having the #60 to cover her for a whole month. Pt has been out of this since yesterday, so I will send to PA pool to see if someone else can write for #60 per Chelle's message?

## 2015-10-24 NOTE — H&P (Signed)
Catherine Farrell. Ketron 10/24/2015 2:15 PM Location: [facility] Patient #: [id number] DOB: Jul 18, 1956 Married / Language: English / Race: White Female  History of Present Illness Ardeth Sportsman MD; 10/24/2015 3:14 PM) The patient is a 60 year old female who presents for evaluation of gall stones. Note for "Gall stones": Patient sent for consultation by Porfirio Oar, physician assistant at [facility]. Concern for symptomatic gallstones.  Pleasant woman. Comes today with her granddaughter. She's been struggling with intermittent abdominal pains for several years. Lives in [place]. Then up in [place]. [place], [place]. Relocated to [place] a few months ago. She notes that she is getting the attacks almost every day now. Her GERD with spicy or heavy foods. She had been on Nexium and other proton pump inhibitors in the past. While that helped some bloating and reflux, and did not prevent the attacks. She has not taken that for several months. He notes the pain tends to be in the RIGHT upper quadrant. It can go to the back but no rales. She will get nauseated. She is intermittently vomiting. She had a colonoscopy in 2012 when she was up in [place]. She claims that was normal. Does not recall ever getting an EGD. She tends to be chronically constipated with a bowel movement about twice a week. Rectal bleeding. No hematochezia or melena. She can walk about 10:15 minutes before she has to stop secondary to discomfort. Does not smoke. She had a bladder tack, tubal ligation, and uterine fibroids removed laparoscopicly. Those were all 20 years ago. No recent surgery.  No personal nor family history of GI/colon cancer, inflammatory bowel disease, irritable bowel syndrome, allergy such as Celiac Sprue, dietary/dairy problems, colitis, ulcers nor gastritis. No recent sick contacts/gastroenteritis. No travel outside the country. No changes in diet. No  dysphagia to solids or liquids. No hematochezia, hematemesis, coffee ground emesis. No evidence of prior gastric/peptic ulceration.   Other Problems Fay Records, CMA; 10/24/2015 2:16 PM) Anxiety Disorder Arthritis Cholelithiasis Depression Gastroesophageal Reflux Disease Hepatitis High blood pressure Hypercholesterolemia  Past Surgical History Fay Records, CMA; 10/24/2015 2:16 PM) Hysterectomy (not due to cancer) - Partial Oral Surgery  Diagnostic Studies History Fay Records, CMA; 10/24/2015 2:16 PM) Colonoscopy 1-5 years ago Pap Smear >5 years ago  Allergies Fay Records, CMA; 10/24/2015 2:16 PM) No Known Drug Allergies 10/24/2015  Medication History Fay Records, CMA; 10/24/2015 2:18 PM) AmLODIPine Besylate (10MG  Tablet, Oral) Active. Metoprolol Tartrate (100MG  Tablet, Oral) Active. Cyclobenzaprine HCl (10MG  Tablet, Oral two times daily) Active. QUEtiapine Fumarate (200MG  Tablet, Oral three times daily) Active. Spironolactone (25MG  Tablet, Oral) Active. Fish Oil (1200MG  Capsule, Oral) Active. Vitamin D (2000UNIT Tablet, Oral) Active. Aspirin (81MG  Tablet, Oral) Active. Colon Care (Oral) Active. Medications Reconciled  Social History Fay Records, [place]; 10/24/2015 2:16 PM) Alcohol use Occasional alcohol use. No caffeine use No drug use Tobacco use Former smoker.  Family History Fay Records, [place]; 10/24/2015 2:16 PM) Alcohol Abuse Brother, Father. Arthritis Mother, Sister. Breast Cancer Family Members In General. Cerebrovascular Accident Sister. Depression Brother, Daughter, Mother, Sister. Diabetes Mellitus Sister. Heart Disease Sister. Heart disease in female family member before age 37 Hypertension Brother, Daughter, Mother, Sister. Kidney Disease Sister. Migraine Headache Daughter. Respiratory Condition Brother.  Pregnancy / Birth History Fay Records, CMA; 10/24/2015 2:16 PM) Age at menarche 13 years. Age of menopause  4-55 Gravida 1 Maternal age 35-25 Para 1     Review of Systems Fay Records CMA; 10/24/2015 2:16 PM) General Present- Fatigue and Weight Gain. Not  Present- Appetite Loss, Chills, Fever, Night Sweats and Weight Loss. Skin Present- Dryness. Not Present- Change in Wart/Mole, Hives, Jaundice, New Lesions, Non-Healing Wounds, Rash and Ulcer. HEENT Present- Hoarseness, Seasonal Allergies and Sinus Pain. Not Present- Earache, Hearing Loss, Nose Bleed, Oral Ulcers, Ringing in the Ears, Sore Throat, Visual Disturbances, Wears glasses/contact lenses and Yellow Eyes. Respiratory Present- Wheezing. Not Present- Bloody sputum, Chronic Cough, Difficulty Breathing and Snoring. Cardiovascular Present- Chest Pain, Difficulty Breathing Lying Down and Leg Cramps. Not Present- Palpitations, Rapid Heart Rate, Shortness of Breath and Swelling of Extremities. Gastrointestinal Present- Abdominal Pain, Bloating, Constipation, Difficulty Swallowing, Excessive gas, Gets full quickly at meals, Indigestion, Nausea and Vomiting. Not Present- Bloody Stool, Change in Bowel Habits, Chronic diarrhea, Hemorrhoids and Rectal Pain. Female Genitourinary Present- Frequency and Nocturia. Not Present- Painful Urination, Pelvic Pain and Urgency. Musculoskeletal Present- Joint Pain, Joint Stiffness, Muscle Pain, Muscle Weakness and Swelling of Extremities. Not Present- Back Pain. Neurological Present- Headaches and Numbness. Not Present- Decreased Memory, Fainting, Seizures, Tingling, Tremor, Trouble walking and Weakness. Psychiatric Present- Anxiety, Bipolar, Depression, Fearful and Frequent crying. Not Present- Change in Sleep Pattern. Endocrine Present- Cold Intolerance. Not Present- Excessive Hunger, Hair Changes, Heat Intolerance, Hot flashes and New Diabetes. Hematology Present- Easy Bruising. Not Present- Excessive bleeding, Gland problems, HIV and Persistent Infections.  Vitals Fay Records(Ashley Beck CMA; 10/24/2015 2:18 PM) 10/24/2015  2:18 PM Weight: 220 lb Height: 65in Body Surface Area: 2.06 m Body Mass Index: 36.61 kg/m  Temp.: 98.11F(Temporal)  Pulse: 79 (Regular)  BP: 130/78 (Sitting, Left Arm, Standard)      Physical Exam Ardeth Sportsman(Jony Ladnier C. Analeese Andreatta MD; 10/24/2015 3:11 PM)  General Mental Status-Alert. General Appearance-Not in acute distress, Not Sickly. Orientation-Oriented X3. Hydration-Well hydrated. Voice-Normal.  Integumentary Global Assessment Upon inspection and palpation of skin surfaces of the - Axillae: non-tender, no inflammation or ulceration, no drainage. and Distribution of scalp and body hair is normal. General Characteristics Temperature - normal warmth is noted.  Head and Neck Head-normocephalic, atraumatic with no lesions or palpable masses. Face Global Assessment - atraumatic, no absence of expression. Neck Global Assessment - no abnormal movements, no bruit auscultated on the right, no bruit auscultated on the left, no decreased range of motion, non-tender. Trachea-midline. Thyroid Gland Characteristics - non-tender.  Eye Eyeball - Left-Extraocular movements intact, No Nystagmus. Eyeball - Right-Extraocular movements intact, No Nystagmus. Cornea - Left-No Hazy. Cornea - Right-No Hazy. Sclera/Conjunctiva - Left-No scleral icterus, No Discharge. Sclera/Conjunctiva - Right-No scleral icterus, No Discharge. Pupil - Left-Direct reaction to light normal. Pupil - Right-Direct reaction to light normal.  ENMT Ears Pinna - Left - no drainage observed, no generalized tenderness observed. Right - no drainage observed, no generalized tenderness observed. Nose and Sinuses External Inspection of the Nose - no destructive lesion observed. Inspection of the nares - Left - quiet respiration. Right - quiet respiration. Mouth and Throat Lips - Upper Lip - no fissures observed, no pallor noted. Lower Lip - no fissures observed, no pallor noted. Nasopharynx -  no discharge present. Oral Cavity/Oropharynx - Tongue - no dryness observed. Oral Mucosa - no cyanosis observed. Hypopharynx - no evidence of airway distress observed.  Chest and Lung Exam Inspection Movements - Normal and Symmetrical. Accessory muscles - No use of accessory muscles in breathing. Palpation Palpation of the chest reveals - Non-tender. Auscultation Breath sounds - Normal and Clear.  Cardiovascular Auscultation Rhythm - Regular. Murmurs & Other Heart Sounds - Auscultation of the heart reveals - No Murmurs and No Systolic Clicks.  Abdomen Inspection  Inspection of the abdomen reveals - No Visible peristalsis and No Abnormal pulsations. Umbilicus - No Bleeding, No Urine drainage. Palpation/Percussion Palpation and Percussion of the abdomen reveal - Soft, Non Tender, No Rebound tenderness, No Rigidity (guarding) and No Cutaneous hyperesthesia. Note: Appel body habitus with central obesity. RIGHT upper quadrant pain. No Murphy sign. No epigastric or LEFT upper quadrant pain. No umbilical hernia.  Female Genitourinary Sexual Maturity Tanner 5 - Adult hair pattern. Note: No vaginal bleeding nor discharge  Peripheral Vascular Upper Extremity Inspection - Left - No Cyanotic nailbeds, Not Ischemic. Right - No Cyanotic nailbeds, Not Ischemic.  Neurologic Neurologic evaluation reveals -normal attention span and ability to concentrate, able to name objects and repeat phrases. Appropriate fund of knowledge , normal sensation and normal coordination. Mental Status Affect - not angry, not paranoid. Cranial Nerves-Normal Bilaterally. Gait-Normal.  Neuropsychiatric Mental status exam performed with findings of-able to articulate well with normal speech/language, rate, volume and coherence, thought content normal with ability to perform basic computations and apply abstract reasoning and no evidence of hallucinations, delusions, obsessions or homicidal/suicidal  ideation.  Musculoskeletal Global Assessment Spine, Ribs and Pelvis - no instability, subluxation or laxity. Right Upper Extremity - no instability, subluxation or laxity.  Lymphatic Head & Neck  General Head & Neck Lymphatics: Bilateral - Description - No Localized lymphadenopathy. Axillary  General Axillary Region: Bilateral - Description - No Localized lymphadenopathy. Femoral & Inguinal  Generalized Femoral & Inguinal Lymphatics: Left - Description - No Localized lymphadenopathy. Right - Description - No Localized lymphadenopathy.    Assessment & Plan Ardeth Sportsman MD; 10/24/2015 3:15 PM)  CHRONIC CHOLECYSTITIS WITH CALCULUS (K80.10) Impression: History and physical and strongly suspicious for symptomatically gallstones on ultrasound. I offered cholecystectomy. She is very interested in it. She's quite miserable.  I suspect she has a component of heartburn/reflux as well. However a challenge to tease out since she definitely has biliary colic. Reevaluate after surgery. Consider trial of over-the-counter proton pump inhibitor for 6 weeks  Current Plans You are being scheduled for surgery - Our schedulers will call you.  You should hear from our office's scheduling department within 5 working days about the location, date, and time of surgery. We try to make accommodations for patient's preferences in scheduling surgery, but sometimes the OR schedule or the surgeon's schedule prevents [place] from making those accommodations.  If you have not heard from our office [phone]) in 5 working days, call the office and ask for your surgeon's nurse.  If you have other questions about your diagnosis, plan, or surgery, call the office and ask for your surgeon's nurse.  The anatomy & physiology of hepatobiliary & pancreatic function was discussed. The pathophysiology of gallbladder dysfunction was discussed. Natural history risks without surgery was discussed. I feel the risks of no  intervention will lead to serious problems that outweigh the operative risks; therefore, I recommended cholecystectomy to remove the pathology. I explained laparoscopic techniques with possible need for an open approach. Probable cholangiogram to evaluate the bilary tract was explained as well.  Risks such as bleeding, infection, abscess, leak, injury to other organs, need for further treatment, heart attack, death, and other risks were discussed. I noted a good likelihood this will help address the problem. Possibility that this will not correct all abdominal symptoms was explained. Goals of post-operative recovery were discussed as well. We will work to minimize complications. An educational handout further explaining the pathology and treatment options was given as well. Questions were answered.  The patient expresses understanding & wishes to proceed with surgery.  Pt Education - Pamphlet Given - Laparoscopic Gallbladder Surgery: discussed with patient and provided information. Written instructions provided Pt Education - [facility] Laparosopic Post Op HCI (Kenitha Glendinning) Pt Education - Laparoscopic Cholecystectomy: gallbladder CHRONIC CONSTIPATION (K59.09) Impression: Evidence of constipation by history and CT scan. Recommend fiber bowel regimen with Metamucil or MiraLAX to improve until having bowel movements every day..  Consider repeat colonoscopy of worsening bowel symptoms. Hold off until surgery is done to see what her new normally has.  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 Liver function tests as well. She tells me she was on antiviral therapy for over a year so perhaps her hep C viral load is negligible and she is "cured"  Ardeth Sportsman, M.D., F.A.C.S. [facility] [address], [address] [place], [place] [address] [phone] Main / Paging

## 2015-10-25 NOTE — Telephone Encounter (Signed)
This has been called in already after clarifying dose with pt.

## 2015-11-01 ENCOUNTER — Ambulatory Visit: Payer: Medicare Other

## 2015-11-02 ENCOUNTER — Encounter: Payer: Self-pay | Admitting: Physician Assistant

## 2015-11-02 DIAGNOSIS — IMO0001 Reserved for inherently not codable concepts without codable children: Secondary | ICD-10-CM | POA: Insufficient documentation

## 2015-11-02 DIAGNOSIS — K8020 Calculus of gallbladder without cholecystitis without obstruction: Secondary | ICD-10-CM

## 2015-11-02 DIAGNOSIS — E785 Hyperlipidemia, unspecified: Secondary | ICD-10-CM

## 2015-11-02 DIAGNOSIS — Z87891 Personal history of nicotine dependence: Secondary | ICD-10-CM | POA: Insufficient documentation

## 2015-11-02 DIAGNOSIS — F319 Bipolar disorder, unspecified: Secondary | ICD-10-CM

## 2015-11-02 DIAGNOSIS — R946 Abnormal results of thyroid function studies: Secondary | ICD-10-CM | POA: Insufficient documentation

## 2015-11-04 ENCOUNTER — Other Ambulatory Visit: Payer: Self-pay | Admitting: Surgery

## 2015-11-04 DIAGNOSIS — K436 Other and unspecified ventral hernia with obstruction, without gangrene: Secondary | ICD-10-CM | POA: Diagnosis not present

## 2015-11-04 DIAGNOSIS — K429 Umbilical hernia without obstruction or gangrene: Secondary | ICD-10-CM | POA: Diagnosis not present

## 2015-11-04 DIAGNOSIS — K760 Fatty (change of) liver, not elsewhere classified: Secondary | ICD-10-CM | POA: Diagnosis not present

## 2015-11-04 DIAGNOSIS — K8010 Calculus of gallbladder with chronic cholecystitis without obstruction: Secondary | ICD-10-CM | POA: Diagnosis not present

## 2015-11-04 DIAGNOSIS — K8050 Calculus of bile duct without cholangitis or cholecystitis without obstruction: Secondary | ICD-10-CM | POA: Diagnosis not present

## 2015-11-08 ENCOUNTER — Emergency Department (HOSPITAL_COMMUNITY): Payer: Medicare Other

## 2015-11-08 ENCOUNTER — Encounter (HOSPITAL_COMMUNITY): Payer: Self-pay | Admitting: Emergency Medicine

## 2015-11-08 ENCOUNTER — Inpatient Hospital Stay (HOSPITAL_COMMUNITY)
Admission: EM | Admit: 2015-11-08 | Discharge: 2015-11-14 | DRG: 193 | Disposition: A | Discharge status: Home / Self Care | Payer: Medicare Other | Attending: Internal Medicine | Admitting: Internal Medicine

## 2015-11-08 DIAGNOSIS — F319 Bipolar disorder, unspecified: Secondary | ICD-10-CM | POA: Diagnosis present

## 2015-11-08 DIAGNOSIS — Z90710 Acquired absence of both cervix and uterus: Secondary | ICD-10-CM

## 2015-11-08 DIAGNOSIS — Z8249 Family history of ischemic heart disease and other diseases of the circulatory system: Secondary | ICD-10-CM

## 2015-11-08 DIAGNOSIS — J189 Pneumonia, unspecified organism: Principal | ICD-10-CM | POA: Diagnosis present

## 2015-11-08 DIAGNOSIS — J9601 Acute respiratory failure with hypoxia: Secondary | ICD-10-CM | POA: Diagnosis not present

## 2015-11-08 DIAGNOSIS — R1084 Generalized abdominal pain: Secondary | ICD-10-CM | POA: Diagnosis not present

## 2015-11-08 DIAGNOSIS — G8918 Other acute postprocedural pain: Secondary | ICD-10-CM | POA: Diagnosis not present

## 2015-11-08 DIAGNOSIS — R0902 Hypoxemia: Secondary | ICD-10-CM | POA: Diagnosis not present

## 2015-11-08 DIAGNOSIS — K5900 Constipation, unspecified: Secondary | ICD-10-CM | POA: Diagnosis present

## 2015-11-08 DIAGNOSIS — Z9049 Acquired absence of other specified parts of digestive tract: Secondary | ICD-10-CM | POA: Diagnosis not present

## 2015-11-08 DIAGNOSIS — R05 Cough: Secondary | ICD-10-CM | POA: Diagnosis not present

## 2015-11-08 DIAGNOSIS — G2581 Restless legs syndrome: Secondary | ICD-10-CM | POA: Diagnosis present

## 2015-11-08 DIAGNOSIS — Z833 Family history of diabetes mellitus: Secondary | ICD-10-CM

## 2015-11-08 DIAGNOSIS — T8189XA Other complications of procedures, not elsewhere classified, initial encounter: Secondary | ICD-10-CM | POA: Diagnosis not present

## 2015-11-08 DIAGNOSIS — Z818 Family history of other mental and behavioral disorders: Secondary | ICD-10-CM | POA: Diagnosis not present

## 2015-11-08 DIAGNOSIS — Z811 Family history of alcohol abuse and dependence: Secondary | ICD-10-CM

## 2015-11-08 DIAGNOSIS — F419 Anxiety disorder, unspecified: Secondary | ICD-10-CM | POA: Diagnosis not present

## 2015-11-08 DIAGNOSIS — K760 Fatty (change of) liver, not elsewhere classified: Secondary | ICD-10-CM | POA: Diagnosis not present

## 2015-11-08 DIAGNOSIS — I10 Essential (primary) hypertension: Secondary | ICD-10-CM | POA: Diagnosis present

## 2015-11-08 DIAGNOSIS — Z801 Family history of malignant neoplasm of trachea, bronchus and lung: Secondary | ICD-10-CM

## 2015-11-08 DIAGNOSIS — Z841 Family history of disorders of kidney and ureter: Secondary | ICD-10-CM

## 2015-11-08 DIAGNOSIS — R0602 Shortness of breath: Secondary | ICD-10-CM | POA: Diagnosis not present

## 2015-11-08 DIAGNOSIS — R5383 Other fatigue: Secondary | ICD-10-CM | POA: Diagnosis not present

## 2015-11-08 DIAGNOSIS — E785 Hyperlipidemia, unspecified: Secondary | ICD-10-CM | POA: Diagnosis not present

## 2015-11-08 LAB — COMPREHENSIVE METABOLIC PANEL
ALBUMIN: 3.2 g/dL — AB (ref 3.5–5.0)
ALT: 37 U/L (ref 14–54)
AST: 27 U/L (ref 15–41)
Alkaline Phosphatase: 77 U/L (ref 38–126)
Anion gap: 9 (ref 5–15)
BUN: 13 mg/dL (ref 6–20)
CHLORIDE: 105 mmol/L (ref 101–111)
CO2: 29 mmol/L (ref 22–32)
Calcium: 8.9 mg/dL (ref 8.9–10.3)
Creatinine, Ser: 0.9 mg/dL (ref 0.44–1.00)
GFR calc Af Amer: >60 mL/min (ref >60)
GFR calc non Af Amer: >60 mL/min (ref >60)
GLUCOSE: 163 mg/dL — AB (ref 65–99)
POTASSIUM: 3.7 mmol/L (ref 3.5–5.1)
SODIUM: 143 mmol/L (ref 135–145)
Total Bilirubin: 0.9 mg/dL (ref 0.3–1.2)
Total Protein: 7 g/dL (ref 6.5–8.1)

## 2015-11-08 LAB — CBC
HEMATOCRIT: 31.8 % — AB (ref 36.0–46.0)
Hemoglobin: 10.1 g/dL — ABNORMAL LOW (ref 12.0–15.0)
MCH: 29.3 pg (ref 26.0–34.0)
MCHC: 31.8 g/dL (ref 30.0–36.0)
MCV: 92.2 fL (ref 78.0–100.0)
Platelets: 296 10*3/uL (ref 150–400)
RBC: 3.45 MIL/uL — ABNORMAL LOW (ref 3.87–5.11)
RDW: 14.5 % (ref 11.5–15.5)
WBC: 17.2 10*3/uL — AB (ref 4.0–10.5)

## 2015-11-08 LAB — TROPONIN I: TROPONIN I: 0.05 ng/mL — AB (ref <0.031)

## 2015-11-08 LAB — I-STAT CG4 LACTIC ACID, ED: Lactic Acid, Venous: 1.39 mmol/L (ref 0.5–2.0)

## 2015-11-08 LAB — LIPASE, BLOOD: Lipase: 16 U/L (ref 11–51)

## 2015-11-08 MED ORDER — LISINOPRIL-HYDROCHLOROTHIAZIDE 20-12.5 MG PO TABS: 1.0000 | ORAL_TABLET | Freq: Every day | ORAL | Status: DC

## 2015-11-08 MED ORDER — HYDROCHLOROTHIAZIDE 12.5 MG PO CAPS
12.5000 mg | ORAL_CAPSULE | Freq: Every day | ORAL | Status: DC
  Administered 2015-11-08 – 2015-11-14 (×7): 12.5 mg via ORAL
  Filled 2015-11-08 (×8): qty 1

## 2015-11-08 MED ORDER — DEXTROSE 5 % IV SOLN
1.0000 g | Freq: Three times a day (TID) | INTRAVENOUS | Status: DC
  Filled 2015-11-08 (×3): qty 1

## 2015-11-08 MED ORDER — CYCLOBENZAPRINE HCL 10 MG PO TABS: 10.0000 mg | ORAL_TABLET | Freq: Three times a day (TID) | ORAL | Status: DC | PRN

## 2015-11-08 MED ORDER — SODIUM CHLORIDE 0.9 % IV BOLUS (SEPSIS)
500.0000 mL | INTRAVENOUS | Status: AC
  Administered 2015-11-08: 500 mL via INTRAVENOUS

## 2015-11-08 MED ORDER — METOPROLOL TARTRATE 50 MG PO TABS
100.0000 mg | ORAL_TABLET | Freq: Two times a day (BID) | ORAL | Status: DC
  Administered 2015-11-08 – 2015-11-14 (×11): 100 mg via ORAL
  Filled 2015-11-08 (×13): qty 2

## 2015-11-08 MED ORDER — LISINOPRIL 10 MG PO TABS
20.0000 mg | ORAL_TABLET | Freq: Every day | ORAL | Status: DC
  Administered 2015-11-08 – 2015-11-14 (×6): 20 mg via ORAL
  Filled 2015-11-08 (×8): qty 2

## 2015-11-08 MED ORDER — IPRATROPIUM-ALBUTEROL 0.5-2.5 (3) MG/3ML IN SOLN
3.0000 mL | Freq: Four times a day (QID) | RESPIRATORY_TRACT | Status: DC
  Administered 2015-11-08 – 2015-11-09 (×4): 3 mL via RESPIRATORY_TRACT
  Filled 2015-11-08 (×4): qty 3

## 2015-11-08 MED ORDER — SPIRONOLACTONE 25 MG PO TABS
25.0000 mg | ORAL_TABLET | Freq: Every day | ORAL | Status: DC
  Administered 2015-11-08 – 2015-11-14 (×7): 25 mg via ORAL
  Filled 2015-11-08 (×8): qty 1

## 2015-11-08 MED ORDER — GUAIFENESIN ER 600 MG PO TB12
1200.0000 mg | ORAL_TABLET | Freq: Two times a day (BID) | ORAL | Status: DC
  Administered 2015-11-08 – 2015-11-14 (×11): 1200 mg via ORAL
  Filled 2015-11-08 (×13): qty 2

## 2015-11-08 MED ORDER — ONDANSETRON HCL 4 MG/2ML IJ SOLN
4.0000 mg | Freq: Once | INTRAMUSCULAR | Status: AC | PRN
  Administered 2015-11-08: 4 mg via INTRAVENOUS
  Filled 2015-11-08: qty 2

## 2015-11-08 MED ORDER — ONDANSETRON HCL 4 MG/2ML IJ SOLN
4.0000 mg | Freq: Once | INTRAMUSCULAR | Status: AC
  Administered 2015-11-08: 4 mg via INTRAVENOUS
  Filled 2015-11-08: qty 2

## 2015-11-08 MED ORDER — FENTANYL CITRATE (PF) 100 MCG/2ML IJ SOLN
50.0000 ug | Freq: Once | INTRAMUSCULAR | Status: AC
  Administered 2015-11-08: 50 ug via INTRAVENOUS
  Filled 2015-11-08: qty 2

## 2015-11-08 MED ORDER — DEXTROSE 5 % IV SOLN
500.0000 mg | INTRAVENOUS | Status: DC
  Administered 2015-11-08 – 2015-11-09 (×2): 500 mg via INTRAVENOUS
  Filled 2015-11-08 (×3): qty 500

## 2015-11-08 MED ORDER — DEXTROSE 5 % IV SOLN
INTRAVENOUS | Status: AC
  Filled 2015-11-08: qty 10

## 2015-11-08 MED ORDER — IPRATROPIUM-ALBUTEROL 0.5-2.5 (3) MG/3ML IN SOLN
3.0000 mL | Freq: Once | RESPIRATORY_TRACT | Status: AC
  Administered 2015-11-08: 3 mL via RESPIRATORY_TRACT
  Filled 2015-11-08: qty 3

## 2015-11-08 MED ORDER — ENOXAPARIN SODIUM 40 MG/0.4ML ~~LOC~~ SOLN
40.0000 mg | SUBCUTANEOUS | Status: DC
  Administered 2015-11-08 – 2015-11-13 (×5): 40 mg via SUBCUTANEOUS
  Filled 2015-11-08 (×5): qty 0.4

## 2015-11-08 MED ORDER — MILK AND MOLASSES ENEMA: 1.0000 | Freq: Once | RECTAL | Status: DC

## 2015-11-08 MED ORDER — DIAZEPAM 5 MG PO TABS: 10.0000 mg | ORAL_TABLET | Freq: Four times a day (QID) | ORAL | Status: DC | PRN

## 2015-11-08 MED ORDER — ONDANSETRON HCL 4 MG/2ML IJ SOLN
4.0000 mg | Freq: Four times a day (QID) | INTRAMUSCULAR | Status: DC | PRN
  Administered 2015-11-08 – 2015-11-12 (×3): 4 mg via INTRAVENOUS
  Filled 2015-11-08 (×3): qty 2

## 2015-11-08 MED ORDER — QUETIAPINE FUMARATE 100 MG PO TABS
200.0000 mg | ORAL_TABLET | Freq: Every day | ORAL | Status: DC
  Administered 2015-11-08 – 2015-11-13 (×6): 200 mg via ORAL
  Filled 2015-11-08 (×6): qty 2

## 2015-11-08 MED ORDER — DEXTROSE 5 % IV SOLN
1.0000 g | INTRAVENOUS | Status: DC
  Administered 2015-11-08 – 2015-11-09 (×2): 1 g via INTRAVENOUS
  Filled 2015-11-08 (×2): qty 10

## 2015-11-08 MED ORDER — DIATRIZOATE MEGLUMINE & SODIUM 66-10 % PO SOLN
ORAL | Status: AC
  Filled 2015-11-08: qty 30

## 2015-11-08 MED ORDER — SODIUM CHLORIDE 0.9 % IV SOLN
INTRAVENOUS | Status: DC
  Administered 2015-11-08 – 2015-11-11 (×6): via INTRAVENOUS

## 2015-11-08 MED ORDER — VANCOMYCIN HCL IN DEXTROSE 1-5 GM/200ML-% IV SOLN: 1000.0000 mg | Freq: Two times a day (BID) | INTRAVENOUS | Status: DC

## 2015-11-08 MED ORDER — IOHEXOL 300 MG/ML  SOLN
100.0000 mL | Freq: Once | INTRAMUSCULAR | Status: AC | PRN
  Administered 2015-11-08: 100 mL via INTRAVENOUS

## 2015-11-08 MED ORDER — ALUM & MAG HYDROXIDE-SIMETH 200-200-20 MG/5ML PO SUSP
30.0000 mL | ORAL | Status: DC | PRN
  Administered 2015-11-08 – 2015-11-12 (×6): 30 mL via ORAL
  Filled 2015-11-08 (×6): qty 30

## 2015-11-08 MED ORDER — ASPIRIN EC 81 MG PO TBEC
81.0000 mg | DELAYED_RELEASE_TABLET | Freq: Every day | ORAL | Status: DC
  Administered 2015-11-08 – 2015-11-14 (×7): 81 mg via ORAL
  Filled 2015-11-08 (×10): qty 1

## 2015-11-08 MED ORDER — DEXTROSE 5 % IV SOLN
INTRAVENOUS | Status: AC
  Filled 2015-11-08: qty 500

## 2015-11-08 MED ORDER — HYDROMORPHONE HCL 1 MG/ML IJ SOLN
1.0000 mg | INTRAMUSCULAR | Status: DC | PRN
  Administered 2015-11-08 – 2015-11-12 (×7): 1 mg via INTRAVENOUS
  Filled 2015-11-08 (×9): qty 1

## 2015-11-08 MED ORDER — VANCOMYCIN HCL IN DEXTROSE 1-5 GM/200ML-% IV SOLN
1000.0000 mg | Freq: Once | INTRAVENOUS | Status: AC
  Administered 2015-11-08: 1000 mg via INTRAVENOUS
  Filled 2015-11-08: qty 200

## 2015-11-08 MED ORDER — AMLODIPINE BESYLATE 5 MG PO TABS
10.0000 mg | ORAL_TABLET | Freq: Two times a day (BID) | ORAL | Status: DC
  Administered 2015-11-08 – 2015-11-14 (×8): 10 mg via ORAL
  Filled 2015-11-08 (×13): qty 2

## 2015-11-08 MED ORDER — DEXTROSE 5 % IV SOLN
2.0000 g | Freq: Once | INTRAVENOUS | Status: AC
  Administered 2015-11-08: 2 g via INTRAVENOUS
  Filled 2015-11-08: qty 2

## 2015-11-08 MED ORDER — SODIUM CHLORIDE 0.9 % IV BOLUS (SEPSIS)
1000.0000 mL | INTRAVENOUS | Status: AC
  Administered 2015-11-08 (×2): 1000 mL via INTRAVENOUS

## 2015-11-08 NOTE — ED Notes (Signed)
Increased O2 to 4L [place] as sats were still in high 70"s. Sats now 91%.

## 2015-11-08 NOTE — Progress Notes (Signed)
Pharmacy Antibiotic Note  Catherine Farrell is a 61 y.o. female admitted on 11/08/2015 with pneumonia.  Pharmacy has been consulted for Vancomycin and Cefepime dosing.  Plan: Vancomycin 1000 IV every 12 hours.  Goal trough 15-20 mcg/mL.  Cefepime 1gm IV q8hrs  Height:  (157.5 cm) Weight: 175 lb (79.379 kg) IBW/kg (Calculated) : 50.1  Temp (24hrs), Avg:98.2 F (36.8 C), Min:98 F (36.7 C), Max:98.4 F (36.9 C)   Recent Labs Lab 11/08/15 0700 11/08/15 0728  WBC 17.2*  --   CREATININE 0.90  --   LATICACIDVEN  --  1.39    Estimated Creatinine Clearance: 66.5 mL/min (by C-G formula based on Cr of 0.9).    Allergies  Allergen Reactions  . Codeine Nausea Only  . Tramadol Hives and Rash   Antimicrobials this admission: 1/31 Vancomycin >>  1/31 Cefepime >>   Results for orders placed or performed during the hospital encounter of 11/08/15  Blood Culture (routine x 2)     Status: None (Preliminary result)   Collection Time: 11/08/15  8:18 AM  Result Value Ref Range Status   Specimen Description BLOOD  Final   Special Requests NONE  Final   Culture NO GROWTH <12 HOURS  Final   Report Status PENDING  Incomplete  Blood Culture (routine x 2)     Status: None (Preliminary result)   Collection Time: 11/08/15  8:20 AM  Result Value Ref Range Status   Specimen Description BLOOD  Final   Special Requests NONE  Final   Culture NO GROWTH <12 HOURS  Final   Report Status PENDING  Incomplete   Thank you for allowing pharmacy to be a part of this patient's care.  Valrie Hart A 11/08/2015 11:02 AM

## 2015-11-08 NOTE — H&P (Signed)
[facility] History and Physical  Jozee Hammer ZOX:[id number] DOB: [birth year] DOA: 11/08/2015  Referring physician: Dr. Bebe Shaggy PCP: JEFFERY,CHELLE, PA-C   Chief Complaint: abdominal pain  HPI: Catherine Farrell is a 60 y.o. female who recently underwent a cholecystectomy on 1/27 and was discharged home the same day. She reports after returning home she had worsening abdominal pain, nausea and vomiting. She is unable to keep anything down by mouth. She said approximately one day after surgery she began to feel short of breath and persistent cough. She's also felt feverish. She describes pain in the right upper quadrant and also diffuse abdominal pain. She has substernal chest pain that is worse with coughing. She's not had a bowel movement in 4 days. She reports being able tolerate liquids, but is been vomiting any solid food. She was evaluated in the emergency room where she was noted to be hypoxic on room air requiring supplemental oxygen. Chest x-ray indicated a dense left pneumonia. CT of the abdomen and pelvis was unremarkable. She's been referred for admission.   Review of Systems:  Pertinent positives as per history of present illness, otherwise negative  Past Medical History  Diagnosis Date  . Blood transfusion without reported diagnosis   . Depression   . Hypertension   . Bipolar 1 disorder (HCC)   . Restless leg   . Hepatitis C   . Neuromuscular disorder (HCC)   . PNA (pneumonia) 08/2009    viral  . Shingles 06/2013    was outside acute window for treatment, started on gabapentin   Past Surgical History  Procedure Laterality Date  . Colon surgery    . Tubal ligation    . Bladder surgery    . Abdominal hysterectomy      partial hysterectomy due to fibroids  . Cholecystectomy    . Hernia repair     Social History:  reports that she has never smoked. She does not have any smokeless tobacco history on file. She reports that she does not drink alcohol or use  illicit drugs.  Allergies  Allergen Reactions  . Codeine Nausea Only  . Tramadol Hives and Rash    Family History  Problem Relation Age of Onset  . Hyperlipidemia Mother   . Hypertension Mother   . Mental illness Mother     bipolar/manic  . Hyperlipidemia Father   . Mental illness Father     bipolar  . Alcohol abuse Father   . Cancer Father     lung cancer  . Hyperlipidemia Sister   . Mental illness Sister     bipolar  . Diabetes Sister   . Kidney disease Sister   . Hyperlipidemia Brother   . Mental illness Brother     bipolar/manic  . Cancer Brother     lung cancer  . Mental illness Brother     bipolar/manic  . Hyperlipidemia Brother   . Mental illness Daughter     bipolar  . Hypertension Daughter   . Scoliosis Daughter   . Diabetes Daughter     Prior to Admission medications   Medication Sig Start Date End Date Taking? Authorizing Provider  amLODipine (NORVASC) 10 MG tablet Take 10 mg by mouth 2 (two) times daily.    Yes Historical Provider, MD  aspirin 81 MG tablet Take 81 mg by mouth daily.   Yes Historical Provider, MD  cyclobenzaprine (FLEXERIL) 10 MG tablet Take 1 tablet (10 mg total) by mouth 3 (three) times daily as needed for muscle spasms.  09/22/15  Yes Chelle Jeffery, PA-C  diazepam (VALIUM) 10 MG tablet TAKE ONE TABLET BY MOUTH EVERY SIX HOURS AS NEEDED FOR ANXIETY 10/24/15  Yes Lanier Clam V, PA-C  lisinopril-hydrochlorothiazide (PRINZIDE,ZESTORETIC) 20-12.5 MG tablet Take 1 tablet by mouth daily.  06/21/15  Yes Historical Provider, MD  metoprolol (LOPRESSOR) 100 MG tablet Take 1 tablet (100 mg total) by mouth 2 (two) times daily. 09/22/15  Yes Chelle Jeffery, PA-C  Omega-3 Fatty Acids (OMEGA-3 FISH OIL PO) Take 1 capsule by mouth daily.    Yes Historical Provider, MD  Probiotic Product (PROBIOTIC DAILY PO) Take 1 capsule by mouth daily.    Yes Historical Provider, MD  QUEtiapine (SEROQUEL) 200 MG tablet Take 200 mg by mouth at bedtime.   Yes Historical  Provider, MD  spironolactone (ALDACTONE) 25 MG tablet Take 25 mg by mouth daily.   Yes Historical Provider, MD   Physical Exam: Filed Vitals:   11/08/15 1021 11/08/15 1105 11/08/15 1109 11/08/15 1300  BP: 141/81 130/68  128/71  Pulse: 77 76  72  Temp: 98 F (36.7 C) 98.9 F (37.2 C)  98.1 F (36.7 C)  TempSrc: Oral Oral  Oral  Resp: Height:   (1.575 m)    Weight:  79.379 kg (175 lb) 102.241 kg (225 lb 6.4 oz)   SpO2: 94% 95%  95%    Wt Readings from Last 3 Encounters:  11/08/15 102.241 kg (225 lb 6.4 oz)  09/22/15 99.338 kg (219 lb)  08/01/15 98.793 kg (217 lb 12.8 oz)    General:  Appears calm and comfortable Eyes: PERRL, normal lids, irises & conjunctiva ENT: grossly normal hearing, lips & tongue Neck: no LAD, masses or thyromegaly Cardiovascular: RRR, no m/r/g. No LE edema. Telemetry: SR, no arrhythmias  Respiratory: coarse breath sounds bilaterally. Normal respiratory effort. Abdomen: soft, non tender, bs+ Skin: no rash or induration seen on limited exam Musculoskeletal: grossly normal tone BUE/BLE Psychiatric: grossly normal mood and affect, speech fluent and appropriate Neurologic: grossly non-focal.          Labs on Admission:  Basic Metabolic Panel:  Recent Labs Lab 11/08/15 0700  NA 143  K 3.7  CL 105  CO2 29  GLUCOSE 163*  BUN 13  CREATININE 0.90  CALCIUM 8.9   Liver Function Tests:  Recent Labs Lab 11/08/15 0700  AST 27  ALT 37  ALKPHOS 77  BILITOT 0.9  PROT 7.0  ALBUMIN 3.2*    Recent Labs Lab 11/08/15 0700  LIPASE 16   No results for input(s): AMMONIA in the last 168 hours. CBC:  Recent Labs Lab 11/08/15 0700  WBC 17.2*  HGB 10.1*  HCT 31.8*  MCV 92.2  PLT 296   Cardiac Enzymes:  Recent Labs Lab 11/08/15 0700  TROPONINI 0.05*    BNP (last 3 results) No results for input(s): BNP in the last 8760 hours.  ProBNP (last 3 results) No results for input(s): PROBNP in the last 8760 hours.  CBG: No  results for input(s): GLUCAP in the last 168 hours.  Radiological Exams on Admission: Ct Abdomen Pelvis W Contrast  11/08/2015  CLINICAL DATA:  Gallbladder removed Friday 09/03/2016. Right upper quadrant pain and distention. Shortness of breath. EXAM: CT ABDOMEN AND PELVIS WITH CONTRAST TECHNIQUE: Multidetector CT imaging of the abdomen and pelvis was performed using the standard protocol following bolus administration of intravenous contrast. CONTRAST:  OMNIPAQUE IOHEXOL 300 MG/ML  SOLN COMPARISON:  Ultrasound 09/30/2015 FINDINGS: Dense consolidation in  the left lower lobe concerning for pneumonia. Small left pleural effusion. Heart is borderline in size. Diffuse fatty infiltration of the liver. Changes of cholecystectomy. No focal fluid collection in the gallbladder fossa. Spleen, pancreas, adrenals and kidneys are unremarkable. Postoperative stranding and gas noted in the region of the umbilicus. No fluid collection. Prior hysterectomy. No adnexal masses. Urinary bladder is unremarkable. Bowel grossly unremarkable. No free fluid, free air, or adenopathy. Aorta is normal caliber with scattered calcifications. No acute bony abnormality or focal bone lesion. IMPRESSION: Dense consolidation in the left lower lobe with small left pleural effusion. Findings concerning for pneumonia. Fatty liver. Prior cholecystectomy without visible complicating feature. Electronically Signed   By: Charlett Nose M.D.   On: 11/08/2015 09:57   Dg Chest Portable 1 View  11/08/2015  CLINICAL DATA:  Cough EXAM: PORTABLE CHEST 1 VIEW COMPARISON:  None. FINDINGS: Dense consolidation in the left lung. Very low lung volumes with right base atelectasis. Cardiomegaly with vascular congestion. No acute bony abnormality. IMPRESSION: Dense consolidation throughout the left lung compatible with pneumonia. Low lung volumes. Right base atelectasis. Cardiomegaly, vascular congestion. Electronically Signed   By: Charlett Nose M.D.   On:  11/08/2015 07:50    EKG: Independently reviewed. No acute changes  Assessment/Plan Active Problems:   Bipolar 1 disorder (HCC)   HTN (hypertension)   Hyperlipidemia   CAP (community acquired pneumonia)   Acute respiratory failure with hypoxia (HCC)   1. Community acquired pneumonia. Patient was in the hospital on 1/27 but was discharged same day. Will treat this as a community-acquired pneumonia. Check urinary antigen. The patient on Rocephin and azithromycin. Continue nebulizer treatments, mucolytics and pulmonary hygiene. 2. Acute respiratory failure related to pneumonia. We'll try to wean down oxygen as tolerated. 3. Hypertension. Stable. Continue outpatient regimen. 4. Hyperlipidemia. Continue statin. 5. Bipolar disorder. Will continue on Seroquel. 6. Nausea and vomiting. Possibly related to constipation. We'll start with a milk and molasses enema. Continue antiemetics. Start on clear liquids and advance diet as tolerated.    Code Status: full code DVT Prophylaxis: lovenox Family Communication: discussed with patient Disposition Plan: discharge home once improved  Time spent:  [facility] Pager [phone]

## 2015-11-08 NOTE — ED Notes (Signed)
Pt given ice chips

## 2015-11-08 NOTE — ED Notes (Signed)
Pt here with abdominal pain. Just had gallbladder removed on Friday and two hernia repairs then as well. No BM since surgery.

## 2015-11-08 NOTE — ED Notes (Signed)
Pt drinking contrast but says is making her too nauseated.  Notified Dr. Bebe Shaggy.

## 2015-11-08 NOTE — ED Notes (Signed)
Pt in CT.

## 2015-11-08 NOTE — ED Notes (Addendum)
Pt reports had gall bladder removed Friday.  C/O ruq pain, abd distention, sob, n/v since then.  Reports LBM was Friday.  Pt's 02 sat on room air ws 78%.  Placed pt on 02 at 2liters, sat increased to 83%.  Increased 02 to 4liters and sat increased to 93%.  Pt has incision to abd in umblicus area.  Incision clean, dry, and intact.

## 2015-11-08 NOTE — ED Provider Notes (Signed)
CSN: [id number]     Arrival date & time 11/08/15  0654 History   First MD Initiated Contact with Patient 11/08/15 [phone]     Chief Complaint  Patient presents with  . Abdominal Pain    Patient is a 60 y.o. female presenting with abdominal pain. The history is provided by the patient.  Abdominal Pain Pain location:  RUQ Pain quality: aching   Pain severity:  Moderate Onset quality:  Gradual Duration:  4 days Timing:  Constant Progression:  Worsening Chronicity:  New Relieved by:  Nothing Worsened by:  Movement and palpation Associated symptoms: chest pain, cough, fatigue, nausea and vomiting   Associated symptoms comment:  CP with cough  pt reports she had cholecystectomy on 11/04/15 at surgical center by Dr Michaell Cowing She reports continued abd pain with nausea/vomiting since the surgery She reports also having significant cough and CP with cough She is not usually on home oxygen She denies fever No HA No back pain   Past Medical History  Diagnosis Date  . Blood transfusion without reported diagnosis   . Depression   . Hypertension   . Bipolar 1 disorder (HCC)   . Restless leg   . Hepatitis C   . Neuromuscular disorder (HCC)   . PNA (pneumonia) 08/2009    viral  . Shingles 06/2013    was outside acute window for treatment, started on gabapentin   Past Surgical History  Procedure Laterality Date  . Colon surgery    . Tubal ligation    . Bladder surgery    . Abdominal hysterectomy      partial hysterectomy due to fibroids  . Cholecystectomy    . Hernia repair     Family History  Problem Relation Age of Onset  . Hyperlipidemia Mother   . Hypertension Mother   . Mental illness Mother     bipolar/manic  . Hyperlipidemia Father   . Mental illness Father     bipolar  . Alcohol abuse Father   . Cancer Father     lung cancer  . Hyperlipidemia Sister   . Mental illness Sister     bipolar  . Diabetes Sister   . Kidney disease Sister   . Hyperlipidemia Brother   .  Mental illness Brother     bipolar/manic  . Cancer Brother     lung cancer  . Mental illness Brother     bipolar/manic  . Hyperlipidemia Brother   . Mental illness Daughter     bipolar  . Hypertension Daughter   . Scoliosis Daughter   . Diabetes Daughter    Social History  Substance Use Topics  . Smoking status: Never Smoker   . Smokeless tobacco: None  . Alcohol Use: No   OB History    No data available     Review of Systems  Constitutional: Positive for fatigue.  Respiratory: Positive for cough.   Cardiovascular: Positive for chest pain.  Gastrointestinal: Positive for nausea, vomiting and abdominal pain.  All other systems reviewed and are negative.     Allergies  Codeine and Tramadol  Home Medications   Prior to Admission medications   Medication Sig Start Date End Date Taking? Authorizing Provider  amLODipine (NORVASC) 10 MG tablet Take 10 mg by mouth daily.    Historical Provider, MD  aspirin 81 MG tablet Take 81 mg by mouth daily.    Historical Provider, MD  cyclobenzaprine (FLEXERIL) 10 MG tablet Take 1 tablet (10 mg total) by mouth 3 (  three) times daily as needed for muscle spasms. 09/22/15   Chelle Jeffery, PA-C  diazepam (VALIUM) 10 MG tablet TAKE ONE TABLET BY MOUTH EVERY SIX HOURS AS NEEDED FOR ANXIETY 10/24/15   Dorna Leitz, PA-C  esomeprazole (NEXIUM) 40 MG packet Take 40 mg by mouth daily before breakfast. 09/22/15   Porfirio Oar, PA-C  lisinopril-hydrochlorothiazide (PRINZIDE,ZESTORETIC) 20-12.5 MG tablet  06/21/15   Historical Provider, MD  metoprolol (LOPRESSOR) 100 MG tablet Take 1 tablet (100 mg total) by mouth 2 (two) times daily. 09/22/15   Porfirio Oar, PA-C  Omega-3 Fatty Acids (OMEGA-3 FISH OIL PO) Take by mouth.    Historical Provider, MD  Probiotic Product (PROBIOTIC DAILY PO) Take by mouth.    Historical Provider, MD  QUEtiapine (SEROQUEL) 200 MG tablet Take 200 mg by mouth at bedtime.    Historical Provider, MD  spironolactone  (ALDACTONE) 25 MG tablet Take 25 mg by mouth daily.    Historical Provider, MD   BP 144/77 mmHg  Pulse 85  Temp(Src) 98.4 F (36.9 C) (Oral)  Resp 20  Ht  (1.626 m)  Wt 79.379 kg  BMI 30.02 kg/m2  SpO2 75% Physical Exam CONSTITUTIONAL: ill appearing HEAD: Normocephalic/atraumatic EYES: EOMI/PERRL ENMT: Mucous membranes moist NECK: supple no meningeal signs SPINE/BACK:entire spine nontender CV: S1/S2 noted, no murmurs/rubs/gallops noted LUNGS: tachypneic, course BS in upper air fields, crackles in the bases ABDOMEN: soft, tenderness in RUQ/RLQ.  No rebound or guarding.  Incision noted that is clean/dry NEURO: Pt is awake/alert/appropriate, moves all extremitiesx4.   EXTREMITIES: pulses normal/equal, full ROM SKIN: warm, color normal PSYCH: no abnormalities of mood noted, alert and oriented to situation  ED Course  Procedures  CRITICAL CARE Performed by: Joya Gaskins Total critical care time: 35 minutes Critical care time was exclusive of separately billable procedures and treating other patients. Critical care was necessary to treat or prevent imminent or life-threatening deterioration. Critical care was time spent personally by me on the following activities: development of treatment plan with patient and/or surrogate as well as nursing, discussions with consultants, evaluation of patient's response to treatment, examination of patient, obtaining history from patient or surrogate, ordering and performing treatments and interventions, ordering and review of laboratory studies, ordering and review of radiographic studies, pulse oximetry and re-evaluation of patient's condition. PATIENT WITH PNEUMONIA WITH HYPOXIA (PULSE OX 75% ON ROOM AIR WHICH IS NEW)  7:48 AM Significant pneumonia noted on CXR She was hypoxic Code sepsis called 8:41 AM Pt still with abd pain Will need Ct imaging Pt noted to have large pneumonia She was recently in surgical center/intubated Broad  spectrum antibiotics ordered Pt is hemodynamically stable and work of breathing is not worsened  CT imaging of abd/pelvis negative Pt hemodynamically stable D/w dr Kerry Hough, will admit  Labs Review Labs Reviewed  COMPREHENSIVE METABOLIC PANEL - Abnormal; Notable for the following:    Glucose, Bld 163 (*)    Albumin 3.2 (*)    All other components within normal limits  CBC - Abnormal; Notable for the following:    WBC 17.2 (*)    RBC 3.45 (*)    Hemoglobin 10.1 (*)    HCT 31.8 (*)    All other components within normal limits  TROPONIN I - Abnormal; Notable for the following:    Troponin I 0.05 (*)    All other components within normal limits  CULTURE, BLOOD (ROUTINE X 2)  CULTURE, BLOOD (ROUTINE X 2)  URINE CULTURE  LIPASE, BLOOD  URINALYSIS, ROUTINE  W REFLEX MICROSCOPIC (NOT AT [facility])  I-STAT CG4 LACTIC ACID, ED  I-STAT CG4 LACTIC ACID, ED    Imaging Review Ct Abdomen Pelvis W Contrast  11/08/2015  CLINICAL DATA:  Gallbladder removed Friday 09/03/2016. Right upper quadrant pain and distention. Shortness of breath. EXAM: CT ABDOMEN AND PELVIS WITH CONTRAST TECHNIQUE: Multidetector CT imaging of the abdomen and pelvis was performed using the standard protocol following bolus administration of intravenous contrast. CONTRAST:  OMNIPAQUE IOHEXOL 300 MG/ML  SOLN COMPARISON:  Ultrasound 09/30/2015 FINDINGS: Dense consolidation in the left lower lobe concerning for pneumonia. Small left pleural effusion. Heart is borderline in size. Diffuse fatty infiltration of the liver. Changes of cholecystectomy. No focal fluid collection in the gallbladder fossa. Spleen, pancreas, adrenals and kidneys are unremarkable. Postoperative stranding and gas noted in the region of the umbilicus. No fluid collection. Prior hysterectomy. No adnexal masses. Urinary bladder is unremarkable. Bowel grossly unremarkable. No free fluid, free air, or adenopathy. Aorta is normal caliber with scattered calcifications.  No acute bony abnormality or focal bone lesion. IMPRESSION: Dense consolidation in the left lower lobe with small left pleural effusion. Findings concerning for pneumonia. Fatty liver. Prior cholecystectomy without visible complicating feature. Electronically Signed   By: Charlett Nose M.D.   On: 11/08/2015 09:57   Dg Chest Portable 1 View  11/08/2015  CLINICAL DATA:  Cough EXAM: PORTABLE CHEST 1 VIEW COMPARISON:  None. FINDINGS: Dense consolidation in the left lung. Very low lung volumes with right base atelectasis. Cardiomegaly with vascular congestion. No acute bony abnormality. IMPRESSION: Dense consolidation throughout the left lung compatible with pneumonia. Low lung volumes. Right base atelectasis. Cardiomegaly, vascular congestion. Electronically Signed   By: Charlett Nose M.D.   On: 11/08/2015 07:50   I have personally reviewed and evaluated these images and lab results as part of my medical decision-making.   EKG Interpretation   Date/Time:  Tuesday November 08 2015 07:06:17 EST Ventricular Rate:  80 PR Interval:  166 QRS Duration: 96 QT Interval:  386 QTC Calculation: 445 R Axis:   8 Text Interpretation:  Sinus rhythm Borderline T abnormalities, anterior  leads Abnormal ekg No previous ECGs available Confirmed by Bebe Shaggy  MD,  Don Tiu ([id number]) on 11/08/2015 7:20:52 AM     Medications  diatrizoate meglumine-sodium (GASTROGRAFIN) 66-10 % solution (not administered)  ondansetron (ZOFRAN) injection 4 mg (4 mg Intravenous Given 11/08/15 0720)  ipratropium-albuterol (DUONEB) 0.5-2.5 (3) MG/3ML nebulizer solution 3 mL (3 mLs Nebulization Given 11/08/15 0807)  fentaNYL (SUBLIMAZE) injection 50 mcg (50 mcg Intravenous Given 11/08/15 0725)  sodium chloride 0.9 % bolus 1,000 mL (0 mLs Intravenous Stopped 11/08/15 0953)    Followed by  sodium chloride 0.9 % bolus 500 mL (0 mLs Intravenous Stopped 11/08/15 1021)  ceFEPIme (MAXIPIME) 2 g in dextrose 5 % 50 mL IVPB (0 g Intravenous Stopped 11/08/15  0858)  vancomycin (VANCOCIN) IVPB 1000 mg/200 mL premix (0 mg Intravenous Stopped 11/08/15 0953)  ondansetron (ZOFRAN) injection 4 mg (4 mg Intravenous Given 11/08/15 0833)  iohexol (OMNIPAQUE) 300 MG/ML solution 100 mL (100 mLs Intravenous Contrast Given 11/08/15 0933)  ondansetron (ZOFRAN) injection 4 mg (4 mg Intravenous Given 11/08/15 1019)    MDM   Final diagnoses:  HCAP (healthcare-associated pneumonia)  Hypoxia  Post-operative pain    Nursing notes including past medical history and social history reviewed and considered in documentation xrays/imaging reviewed by myself and considered during evaluation Labs/vital reviewed myself and considered during evaluation     Zadie Rhine, MD 11/08/15 1046

## 2015-11-08 NOTE — ED Notes (Signed)
EDP aware pt can't tolerate contrast.  Instructed to perform scan without it.

## 2015-11-08 NOTE — ED Notes (Signed)
EDP aware fluids are almost finished.  VSS.  Pt c/o nausea but no vomiting at this time.  Dr. Bebe Shaggy aware, no further orders at this time.  Pt resting, eyes closed.

## 2015-11-08 NOTE — ED Notes (Signed)
Pt vomiting, small amounts of bright red blood seen in emesis.  EDP aware.

## 2015-11-09 DIAGNOSIS — E785 Hyperlipidemia, unspecified: Secondary | ICD-10-CM

## 2015-11-09 DIAGNOSIS — J189 Pneumonia, unspecified organism: Principal | ICD-10-CM

## 2015-11-09 DIAGNOSIS — I10 Essential (primary) hypertension: Secondary | ICD-10-CM

## 2015-11-09 DIAGNOSIS — J9601 Acute respiratory failure with hypoxia: Secondary | ICD-10-CM

## 2015-11-09 LAB — COMPREHENSIVE METABOLIC PANEL
ALT: 37 U/L (ref 14–54)
AST: 25 U/L (ref 15–41)
Albumin: 2.6 g/dL — ABNORMAL LOW (ref 3.5–5.0)
Alkaline Phosphatase: 70 U/L (ref 38–126)
Anion gap: 7 (ref 5–15)
BILIRUBIN TOTAL: 0.4 mg/dL (ref 0.3–1.2)
BUN: 13 mg/dL (ref 6–20)
CO2: 30 mmol/L (ref 22–32)
CREATININE: 0.8 mg/dL (ref 0.44–1.00)
Calcium: 7.7 mg/dL — ABNORMAL LOW (ref 8.9–10.3)
Chloride: 109 mmol/L (ref 101–111)
Glucose, Bld: 118 mg/dL — ABNORMAL HIGH (ref 65–99)
POTASSIUM: 3.6 mmol/L (ref 3.5–5.1)
Sodium: 146 mmol/L — ABNORMAL HIGH (ref 135–145)
TOTAL PROTEIN: 6.1 g/dL — AB (ref 6.5–8.1)

## 2015-11-09 LAB — CBC
HEMATOCRIT: 28.7 % — AB (ref 36.0–46.0)
Hemoglobin: 9 g/dL — ABNORMAL LOW (ref 12.0–15.0)
MCH: 29.3 pg (ref 26.0–34.0)
MCHC: 31.4 g/dL (ref 30.0–36.0)
MCV: 93.5 fL (ref 78.0–100.0)
PLATELETS: 247 10*3/uL (ref 150–400)
RBC: 3.07 MIL/uL — ABNORMAL LOW (ref 3.87–5.11)
RDW: 14.6 % (ref 11.5–15.5)
WBC: 7.4 10*3/uL (ref 4.0–10.5)

## 2015-11-09 LAB — URINALYSIS, ROUTINE W REFLEX MICROSCOPIC
Bilirubin Urine: NEGATIVE
Glucose, UA: NEGATIVE mg/dL
Hgb urine dipstick: NEGATIVE
Ketones, ur: NEGATIVE mg/dL
LEUKOCYTES UA: NEGATIVE
NITRITE: NEGATIVE
PROTEIN: NEGATIVE mg/dL
SPECIFIC GRAVITY, URINE: 1.015 (ref 1.005–1.030)
pH: 6 (ref 5.0–8.0)

## 2015-11-09 LAB — HIV ANTIBODY (ROUTINE TESTING W REFLEX): HIV Screen 4th Generation wRfx: NONREACTIVE

## 2015-11-09 MED ORDER — DEXTROSE 5 % IV SOLN
2.0000 g | INTRAVENOUS | Status: DC
  Administered 2015-11-10 – 2015-11-13 (×4): 2 g via INTRAVENOUS
  Filled 2015-11-09 (×4): qty 2

## 2015-11-09 MED ORDER — SODIUM CHLORIDE 0.9% FLUSH
3.0000 mL | Freq: Two times a day (BID) | INTRAVENOUS | Status: DC
  Administered 2015-11-11 – 2015-11-14 (×6): 3 mL via INTRAVENOUS

## 2015-11-09 MED ORDER — SODIUM CHLORIDE 0.9 % IV SOLN: 250.0000 mL | INTRAVENOUS | Status: DC | PRN

## 2015-11-09 MED ORDER — BENZONATATE 100 MG PO CAPS
100.0000 mg | ORAL_CAPSULE | Freq: Three times a day (TID) | ORAL | Status: DC | PRN
  Administered 2015-11-09 – 2015-11-10 (×4): 100 mg via ORAL
  Filled 2015-11-09 (×4): qty 1

## 2015-11-09 MED ORDER — SODIUM CHLORIDE 0.9% FLUSH
3.0000 mL | INTRAVENOUS | Status: DC | PRN
Start: 2015-11-09 — End: 2015-11-14

## 2015-11-09 NOTE — Progress Notes (Signed)
[facility] PROGRESS NOTE  Catherine Farrell ZOX:[id number] DOB: 1955/11/22 DOA: 11/08/2015 PCP: JEFFERY,CHELLE, PA-C  HPI/Brief narrative 60 y.o. female who recently underwent a cholecystectomy on 1/27 and was discharged home the same day. She reports after returning home she had worsening abdominal pain, nausea and vomiting. Imaging demonstrated a dense L sided pneumonia.  Assessment/Plan: 1. Community acquired pneumonia. Patient was in the hospital on 1/27 but was discharged same day. Currently treated as community-acquired pneumonia. Check urinary antigen, pending. For now, continue the patient on Rocephin and azithromycin. Continue nebulizer treatments, mucolytics and pulmonary hygiene. 2. Acute hypoxic respiratory failure related to pneumonia. Remains on 4-5LNC. We'll try to wean down oxygen as tolerated. 3. Hypertension. Stable. Continue outpatient regimen. 4. Hyperlipidemia. Continue statin. 5. Bipolar disorder. Will continue on Seroquel. 6. Nausea and vomiting. Possibly related to constipation. Start on clear liquids and advance diet as tolerated.  Code Status: Full Family Communication: Pt in room Disposition Plan: anticipate d/c home when on min O2 support and off IV meds   Consultants:    Procedures:    Antibiotics: Anti-infectives    Start     Dose/Rate Route Frequency Ordered Stop   11/08/15 1830  azithromycin (ZITHROMAX) 500 mg in dextrose 5 % 250 mL IVPB     500 mg 250 mL/hr over 60 Minutes Intravenous Every 24 hours 11/08/15 1732 11/15/15 1829   11/08/15 1800  vancomycin (VANCOCIN) IVPB 1000 mg/200 mL premix  Status:  Discontinued     1,000 mg 200 mL/hr over 60 Minutes Intravenous Every 12 hours 11/08/15 1059 11/08/15 1732   11/08/15 1800  cefTRIAXone (ROCEPHIN) 1 g in dextrose 5 % 50 mL IVPB     1 g 100 mL/hr over 30 Minutes Intravenous Every 24 hours 11/08/15 1732 11/15/15 1814   11/08/15 1700  ceFEPIme (MAXIPIME) 1 g in dextrose 5 % 50 mL IVPB   Status:  Discontinued     1 g 100 mL/hr over 30 Minutes Intravenous Every 8 hours 11/08/15 1057 11/08/15 1732   11/08/15 0800  ceFEPIme (MAXIPIME) 2 g in dextrose 5 % 50 mL IVPB     2 g 100 mL/hr over 30 Minutes Intravenous  Once 11/08/15 0745 11/08/15 0858   11/08/15 0800  vancomycin (VANCOCIN) IVPB 1000 mg/200 mL premix     1,000 mg 200 mL/hr over 60 Minutes Intravenous  Once 11/08/15 0745 11/08/15 0953      HPI/Subjective: Feels better today  Objective: Filed Vitals:   11/09/15 0800 11/09/15 0849 11/09/15 1431 11/09/15 1436  BP:  100/46  114/58  Pulse:  69  68  Temp:    98.2 F (36.8 C)  TempSrc:    Oral  Resp:    18  Height:      Weight:      SpO2: 93%  88% 94%    Intake/Output Summary (Last 24 hours) at 11/09/15 1611 Last data filed at 11/09/15 1300  Gross per 24 hour  Intake 1413.75 ml  Output    100 ml  Net 1313.75 ml   Filed Weights   11/08/15 0800 11/08/15 1105 11/08/15 1109  Weight: 79.379 kg (175 lb) 79.379 kg (175 lb) 102.241 kg (225 lb 6.4 oz)    Exam:   General:  Awake, in nad  Cardiovascular: regular, s1, s2  Respiratory: normal resp effort, decreased BS on L  Abdomen: soft, nondistended  Musculoskeletal: perfused, no clubbing   Data Reviewed: Basic Metabolic Panel:  Recent Labs Lab 11/08/15 0700 11/09/15 0645  NA 143 146*  K 3.7 3.6  CL 105 109  CO2 29 30  GLUCOSE 163* 118*  BUN 13 13  CREATININE 0.90 0.80  CALCIUM 8.9 7.7*   Liver Function Tests:  Recent Labs Lab 11/08/15 0700 11/09/15 0645  AST 27 25  ALT 37 37  ALKPHOS 77 70  BILITOT 0.9 0.4  PROT 7.0 6.1*  ALBUMIN 3.2* 2.6*    Recent Labs Lab 11/08/15 0700  LIPASE 16   No results for input(s): AMMONIA in the last 168 hours. CBC:  Recent Labs Lab 11/08/15 0700 11/09/15 0645  WBC 17.2* 7.4  HGB 10.1* 9.0*  HCT 31.8* 28.7*  MCV 92.2 93.5  PLT 296 247   Cardiac Enzymes:  Recent Labs Lab 11/08/15 0700  TROPONINI 0.05*   BNP (last 3 results) No  results for input(s): BNP in the last 8760 hours.  ProBNP (last 3 results) No results for input(s): PROBNP in the last 8760 hours.  CBG: No results for input(s): GLUCAP in the last 168 hours.  Recent Results (from the past 240 hour(s))  Blood Culture (routine x 2)     Status: None (Preliminary result)   Collection Time: 11/08/15  8:18 AM  Result Value Ref Range Status   Specimen Description BLOOD RIGHT ANTECUBITAL DRAWN BY RN  Final   Special Requests   Final    BOTTLES DRAWN AEROBIC AND ANAEROBIC AEB=8CC ANA=12CC   Culture NO GROWTH 1 DAY  Final   Report Status PENDING  Incomplete  Blood Culture (routine x 2)     Status: None (Preliminary result)   Collection Time: 11/08/15  8:20 AM  Result Value Ref Range Status   Specimen Description BLOOD RIGHT ANTECUBITAL  Final   Special Requests BOTTLES DRAWN AEROBIC AND ANAEROBIC 10CC EACH  Final   Culture NO GROWTH 1 DAY  Final   Report Status PENDING  Incomplete     Studies: Ct Abdomen Pelvis W Contrast  11/08/2015  CLINICAL DATA:  Gallbladder removed Friday 09/03/2016. Right upper quadrant pain and distention. Shortness of breath. EXAM: CT ABDOMEN AND PELVIS WITH CONTRAST TECHNIQUE: Multidetector CT imaging of the abdomen and pelvis was performed using the standard protocol following bolus administration of intravenous contrast. CONTRAST:  OMNIPAQUE IOHEXOL 300 MG/ML  SOLN COMPARISON:  Ultrasound 09/30/2015 FINDINGS: Dense consolidation in the left lower lobe concerning for pneumonia. Small left pleural effusion. Heart is borderline in size. Diffuse fatty infiltration of the liver. Changes of cholecystectomy. No focal fluid collection in the gallbladder fossa. Spleen, pancreas, adrenals and kidneys are unremarkable. Postoperative stranding and gas noted in the region of the umbilicus. No fluid collection. Prior hysterectomy. No adnexal masses. Urinary bladder is unremarkable. Bowel grossly unremarkable. No free fluid, free air, or  adenopathy. Aorta is normal caliber with scattered calcifications. No acute bony abnormality or focal bone lesion. IMPRESSION: Dense consolidation in the left lower lobe with small left pleural effusion. Findings concerning for pneumonia. Fatty liver. Prior cholecystectomy without visible complicating feature. Electronically Signed   By: Charlett Nose M.D.   On: 11/08/2015 09:57   Dg Chest Portable 1 View  11/08/2015  CLINICAL DATA:  Cough EXAM: PORTABLE CHEST 1 VIEW COMPARISON:  None. FINDINGS: Dense consolidation in the left lung. Very low lung volumes with right base atelectasis. Cardiomegaly with vascular congestion. No acute bony abnormality. IMPRESSION: Dense consolidation throughout the left lung compatible with pneumonia. Low lung volumes. Right base atelectasis. Cardiomegaly, vascular congestion. Electronically Signed   By: Charlett Nose M.D.   On: 11/08/2015 07:50  Scheduled Meds: . amLODipine  10 mg Oral BID  . aspirin EC  81 mg Oral Daily  . azithromycin  500 mg Intravenous Q24H  . cefTRIAXone (ROCEPHIN)  IV  1 g Intravenous Q24H  . enoxaparin (LOVENOX) injection  40 mg Subcutaneous Q24H  . guaiFENesin  1,200 mg Oral BID  . lisinopril  20 mg Oral Daily   And  . hydrochlorothiazide  12.5 mg Oral Daily  . ipratropium-albuterol  3 mL Nebulization Q6H  . metoprolol  100 mg Oral BID  . milk and molasses  1 enema Rectal Once  . QUEtiapine  200 mg Oral QHS  . [START ON 11/10/2015] sodium chloride flush  3 mL Intravenous Q12H  . spironolactone  25 mg Oral Daily   Continuous Infusions: . sodium chloride 125 mL/hr at 11/09/15 0355    Active Problems:   Bipolar 1 disorder (HCC)   HTN (hypertension)   Hyperlipidemia   CAP (community acquired pneumonia)   Acute respiratory failure with hypoxia (HCC)    Lemoyne Nestor K  [facility] Pager [phone]. If 7PM-7AM, please contact night-coverage at [email or web address], password [facility] 11/09/2015, 4:11 PM  LOS: 1 day

## 2015-11-09 NOTE — Progress Notes (Signed)
BP 100/44 and HR 69. BP medications due. MD notified/aware and orders placed. Will hold linsipril and amlodipine. Will give Metoprolol. RN will continue to monitor pt. Lesly Dukes, RN

## 2015-11-09 NOTE — Care Management Note (Signed)
Case Management Note  Patient Details  Name: Catherine Farrell MRN: [id number] Date of Birth: January 30, 1956  Subjective/Objective:         Spoke with patient for discharge plannig. Patient is from home by herself and stated that she drives herself and her adult daughter lives beside her and helps her as well. Denies any difficulty with medications. No DME in the home.  Action/Plan:  No CM need anticipated , Home with self care. Expected Discharge Date:                  Expected Discharge Plan:  Home/Self Care  In-House Referral:     Discharge [place] not met per provider  Post Acute Care Choice:    Choice offered to:     DME Arranged:    DME Agency:     HH Arranged:    HH Agency:     Status of Service:  In process, will continue to follow  Medicare Important Message Given:    Date Medicare IM Given:    Medicare IM give by:    Date Additional Medicare IM Given:    Additional Medicare Important Message give by:     If discussed at [place] of Stay Meetings, dates discussed:    Additional Comments:  Alvie Heidelberg, RN 11/09/2015, 3:46 PM

## 2015-11-09 NOTE — Treatment Plan (Signed)
Blood cultures returned pos for gm pos in pairs. Discussed with pharmacy. Current abx regimen should offer good coverage. Added, pt has shown marked improvement thus far on current abx, thus would continue rocephin and azithro for now. Follow final culture results.

## 2015-11-10 ENCOUNTER — Inpatient Hospital Stay (HOSPITAL_COMMUNITY): Payer: Medicare Other

## 2015-11-10 DIAGNOSIS — F319 Bipolar disorder, unspecified: Secondary | ICD-10-CM

## 2015-11-10 LAB — BASIC METABOLIC PANEL
Anion gap: 10 (ref 5–15)
BUN: 7 mg/dL (ref 6–20)
CHLORIDE: 106 mmol/L (ref 101–111)
CO2: 29 mmol/L (ref 22–32)
Calcium: 7.7 mg/dL — ABNORMAL LOW (ref 8.9–10.3)
Creatinine, Ser: 0.79 mg/dL (ref 0.44–1.00)
GFR calc Af Amer: >60 mL/min (ref >60)
GFR calc non Af Amer: >60 mL/min (ref >60)
Glucose, Bld: 103 mg/dL — ABNORMAL HIGH (ref 65–99)
POTASSIUM: 3.6 mmol/L (ref 3.5–5.1)
Sodium: 145 mmol/L (ref 135–145)

## 2015-11-10 LAB — URINE CULTURE: CULTURE: NO GROWTH

## 2015-11-10 LAB — CBC
HEMATOCRIT: 28.2 % — AB (ref 36.0–46.0)
Hemoglobin: 8.8 g/dL — ABNORMAL LOW (ref 12.0–15.0)
MCH: 28.8 pg (ref 26.0–34.0)
MCHC: 31.2 g/dL (ref 30.0–36.0)
MCV: 92.2 fL (ref 78.0–100.0)
PLATELETS: 258 10*3/uL (ref 150–400)
RBC: 3.06 MIL/uL — ABNORMAL LOW (ref 3.87–5.11)
RDW: 14.4 % (ref 11.5–15.5)
WBC: 6.1 10*3/uL (ref 4.0–10.5)

## 2015-11-10 MED ORDER — ALBUTEROL SULFATE (2.5 MG/3ML) 0.083% IN NEBU
2.5000 mg | INHALATION_SOLUTION | RESPIRATORY_TRACT | Status: DC | PRN
  Administered 2015-11-10: 2.5 mg via RESPIRATORY_TRACT
  Filled 2015-11-10: qty 3

## 2015-11-10 MED ORDER — METHYLPREDNISOLONE SODIUM SUCC 40 MG IJ SOLR
40.0000 mg | Freq: Two times a day (BID) | INTRAMUSCULAR | Status: DC
  Administered 2015-11-10 – 2015-11-13 (×6): 40 mg via INTRAVENOUS
  Filled 2015-11-10 (×6): qty 1

## 2015-11-10 MED ORDER — POLYETHYLENE GLYCOL 3350 17 G PO PACK
17.0000 g | PACK | Freq: Every day | ORAL | Status: DC
  Administered 2015-11-10 – 2015-11-12 (×3): 17 g via ORAL
  Filled 2015-11-10 (×4): qty 1

## 2015-11-10 MED ORDER — AZITHROMYCIN 250 MG PO TABS
500.0000 mg | ORAL_TABLET | ORAL | Status: DC
  Administered 2015-11-10 – 2015-11-13 (×3): 500 mg via ORAL
  Filled 2015-11-10 (×3): qty 2

## 2015-11-10 MED ORDER — IPRATROPIUM-ALBUTEROL 0.5-2.5 (3) MG/3ML IN SOLN
3.0000 mL | Freq: Four times a day (QID) | RESPIRATORY_TRACT | Status: DC
  Administered 2015-11-10 – 2015-11-12 (×10): 3 mL via RESPIRATORY_TRACT
  Filled 2015-11-10 (×10): qty 3

## 2015-11-10 NOTE — Progress Notes (Signed)
Patients O2 sats were 88 this morning, called RT, will reassess patient after treatment and continue to monitor the patient.

## 2015-11-10 NOTE — Consult Note (Signed)
Catherine Farrell, RAYOS NO.:  [id number]  MEDICAL RECORD NO.:  [id number]  LOCATION:  A336                          FACILITY:  [facility]  PHYSICIAN:  Denelle Capurro L. Juanetta Gosling, M.D.DATE OF BIRTH:  Jan 21, 1956  DATE OF CONSULTATION: DATE OF DISCHARGE:                                CONSULTATION   Patient of [facility].  REASON FOR CONSULTATION:  Pneumonia.  HISTORY:  [name], who came to the emergency department on the 31st, with cough and congestion.  She was discovered to have an extensive left-sided pneumonia.  She is improving, but is still short of breath.  She has no other new complaints.  She said she had pneumonia previously and at that time, it was always described as double pneumonia.  She had cholecystectomy on November 04, 2015, and was able to be discharged.  She has a history of hypertension, anxiety, and depression/bipolar disease, neuromuscular disorder, previous episode of pneumonia.  PAST SURGICAL HISTORY:  Surgically, she has had bladder surgery, abdominal hysterectomy, tubal ligation, cholecystectomy, and hernia repair.  SOCIAL HISTORY:  She has never smoked.  She does not have any smokeless tobacco history.  She does not use alcohol.  FAMILY HISTORY:  Positive for some lung disease.  MEDICATIONS:  Her medications, I have reviewed.  She is on appropriate medications for pneumonia.  PHYSICAL EXAMINATION:  GENERAL:  An obese female, who is in no acute distress. VITAL SIGNS:  Blood pressure 128/70, pulse 72, respirations 18, O2 sat 95%.  She is afebrile. HEENT:  Generally unremarkable.  Her neck is supple without masses.  Her pupils are reactive.  Nose and throat are clear.  Mucous membranes are moist. CHEST:  Rhonchi bilaterally. HEART:  Regular without gallop. ABDOMEN:  Soft, without masses. EXTREMITIES:  No edema. CENTRAL NERVOUS SYSTEM:  Grossly intact.  Chest x-ray shows an extensive left lung pneumonia.  Assessment  then is that she has pneumonia.  She is on appropriate treatment, I did add steroids.  Would plan to continue with everything else in the mean time.  Thanks for allowing me to see her with you.  I will be out of town until November 14, 2015.     Reyan Helle L. Juanetta Gosling, M.D.     ELH/MEDQ  D:  11/10/2015  T:  11/10/2015  Job:  [id number]

## 2015-11-10 NOTE — Progress Notes (Signed)
PHARMACIST - PHYSICIAN COMMUNICATION DR:   [facility] CONCERNING: Antibiotic IV to Oral Route Change Policy  RECOMMENDATION: This patient is receiving Azithromycin by the intravenous route.  Based on criteria approved by the Pharmacy and Therapeutics Committee, the antibiotic(s) is/are being converted to the equivalent oral dose form(s).   DESCRIPTION: These criteria include:  Patient being treated for a respiratory tract infection, urinary tract infection, cellulitis or clostridium difficile associated diarrhea if on metronidazole  The patient is not neutropenic and does not exhibit a GI malabsorption state  The patient is eating (either orally or via tube) and/or has been taking other orally administered medications for a least 24 hours  The patient is improving clinically and has a Tmax < 100.5  If you have questions about this conversion, please contact the Pharmacy Department    [phone] )  [facility]   [phone] )  [facility]   [phone] )  [facility]   [phone] )  [facility]   [phone] )  [facility]    Mady Gemma, [facility]  11/10/2015 1:21 PM

## 2015-11-10 NOTE — Progress Notes (Signed)
[facility] PROGRESS NOTE  Kayliah Tindol ZOX:[id number] DOB: 01/26/1956 DOA: 11/08/2015 PCP: JEFFERY,CHELLE, PA-C  HPI/Brief narrative 60 y.o. female who recently underwent a cholecystectomy on 1/27 and was discharged home the same day. She reports after returning home she had worsening abdominal pain, nausea and vomiting. Imaging demonstrated a dense L sided pneumonia.  Assessment/Plan: 1. Community acquired pneumonia without sepsis on admission. Patient was in the hospital on 1/27 but was discharged same day. Currently treated as community-acquired pneumonia. Check urinary antigen, pending. Patient is continued on Rocephin and azithromycin. Continue nebulizer treatments, mucolytics and pulmonary hygiene. Overnight, patient's O2 requirements have worsened to 6L. CXR this AM appears worse per my own read, although patient claims to feel better. Given increasing O2 requirements, will consult Pulmonary for further assistance. Cont current abx for now 2. Acute hypoxic respiratory failure related to pneumonia. O2 requirements now up to 6L overnight from 4L on admit. We'll try to wean down oxygen as tolerated. Per above 3. Hypertension. Stable. Continue outpatient regimen. 4. Hyperlipidemia. Continue statin. 5. Bipolar disorder. Will continue on Seroquel. 6. Nausea and vomiting. Possibly related to constipation. Start on clear liquids and advance diet as tolerated.  Code Status: Full Family Communication: Pt in room Disposition Plan: anticipate d/c home when on min O2 support and off IV meds   Consultants:  Pulmonary  Procedures:    Antibiotics: Anti-infectives    Start     Dose/Rate Route Frequency Ordered Stop   11/10/15 1800  cefTRIAXone (ROCEPHIN) 2 g in dextrose 5 % 50 mL IVPB     2 g 100 mL/hr over 30 Minutes Intravenous Every 24 hours 11/09/15 1814 11/15/15 1759   11/10/15 1800  azithromycin (ZITHROMAX) tablet 500 mg     500 mg Oral Every 24 hrs x 5 11/10/15 1320  11/15/15 1759   11/08/15 1830  azithromycin (ZITHROMAX) 500 mg in dextrose 5 % 250 mL IVPB  Status:  Discontinued     500 mg 250 mL/hr over 60 Minutes Intravenous Every 24 hours 11/08/15 1732 11/10/15 1320   11/08/15 1800  vancomycin (VANCOCIN) IVPB 1000 mg/200 mL premix  Status:  Discontinued     1,000 mg 200 mL/hr over 60 Minutes Intravenous Every 12 hours 11/08/15 1059 11/08/15 1732   11/08/15 1800  cefTRIAXone (ROCEPHIN) 1 g in dextrose 5 % 50 mL IVPB  Status:  Discontinued     1 g 100 mL/hr over 30 Minutes Intravenous Every 24 hours 11/08/15 1732 11/09/15 1814   11/08/15 1700  ceFEPIme (MAXIPIME) 1 g in dextrose 5 % 50 mL IVPB  Status:  Discontinued     1 g 100 mL/hr over 30 Minutes Intravenous Every 8 hours 11/08/15 1057 11/08/15 1732   11/08/15 0800  ceFEPIme (MAXIPIME) 2 g in dextrose 5 % 50 mL IVPB     2 g 100 mL/hr over 30 Minutes Intravenous  Once 11/08/15 0745 11/08/15 0858   11/08/15 0800  vancomycin (VANCOCIN) IVPB 1000 mg/200 mL premix     1,000 mg 200 mL/hr over 60 Minutes Intravenous  Once 11/08/15 0745 11/08/15 0953      HPI/Subjective: States feeling better  Objective: Filed Vitals:   11/10/15 0804 11/10/15 0821 11/10/15 1049 11/10/15 1150  BP:  126/62 145/85   Pulse:  71    Temp:  98.3 F (36.8 C)    TempSrc:  Oral    Resp:  20    Height:      Weight:      SpO2: 95% 94%  88%    Intake/Output Summary (Last 24 hours) at 11/10/15 1341 Last data filed at 11/10/15 0211  Gross per 24 hour  Intake 3257.5 ml  Output    700 ml  Net 2557.5 ml   Filed Weights   11/08/15 0800 11/08/15 1105 11/08/15 1109  Weight: 79.379 kg (175 lb) 79.379 kg (175 lb) 102.241 kg (225 lb 6.4 oz)    Exam:   General:  Awake, in nad, laying in bed  Cardiovascular: regular, s1, s2  Respiratory: normal resp effort, decreased BS on L  Abdomen: soft, nondistended  Musculoskeletal: perfused, no clubbing, no cyanosis  Data Reviewed: Basic Metabolic Panel:  Recent  Labs Lab 11/08/15 0700 11/09/15 0645 11/10/15 0531  NA 143 146* 145  K 3.7 3.6 3.6  CL 105 109 106  CO2 GLUCOSE 163* 118* 103*  BUN CREATININE 0.90 0.80 0.79  CALCIUM 8.9 7.7* 7.7*   Liver Function Tests:  Recent Labs Lab 11/08/15 0700 11/09/15 0645  AST 27 25  ALT 37 37  ALKPHOS 77 70  BILITOT 0.9 0.4  PROT 7.0 6.1*  ALBUMIN 3.2* 2.6*    Recent Labs Lab 11/08/15 0700  LIPASE 16   No results for input(s): AMMONIA in the last 168 hours. CBC:  Recent Labs Lab 11/08/15 0700 11/09/15 0645 11/10/15 0531  WBC 17.2* 7.4 6.1  HGB 10.1* 9.0* 8.8*  HCT 31.8* 28.7* 28.2*  MCV 92.2 93.5 92.2  PLT 296 247 258   Cardiac Enzymes:  Recent Labs Lab 11/08/15 0700  TROPONINI 0.05*   BNP (last 3 results) No results for input(s): BNP in the last 8760 hours.  ProBNP (last 3 results) No results for input(s): PROBNP in the last 8760 hours.  CBG: No results for input(s): GLUCAP in the last 168 hours.  Recent Results (from the past 240 hour(s))  Blood Culture (routine x 2)     Status: None (Preliminary result)   Collection Time: 11/08/15  8:18 AM  Result Value Ref Range Status   Specimen Description BLOOD RIGHT ANTECUBITAL DRAWN BY RN  Final   Special Requests   Final    BOTTLES DRAWN AEROBIC AND ANAEROBIC AEB=8CC ANA=12CC   Culture NO GROWTH 2 DAYS  Final   Report Status PENDING  Incomplete  Blood Culture (routine x 2)     Status: None (Preliminary result)   Collection Time: 11/08/15  8:20 AM  Result Value Ref Range Status   Specimen Description BLOOD RIGHT ANTECUBITAL  Final   Special Requests BOTTLES DRAWN AEROBIC AND ANAEROBIC 10CC EACH  Final   Culture  Setup Time   Final    GRAM POSITIVE COCCI IN PAIRS RECOVERED FROM THE ANAEROBIC BOTTLE Gram Stain Report Called to,Read Back By and Verified With: EVERETT,R. AT 1754 ON 11/09/2015 BY BAUGHAM,M. Performed at [facility]    Culture   Final    GRAM POSITIVE COCCI CULTURE  REINCUBATED FOR BETTER GROWTH Performed at [facility]    Report Status PENDING  Incomplete  Urine culture     Status: None   Collection Time: 11/09/15 12:00 PM  Result Value Ref Range Status   Specimen Description URINE, CLEAN CATCH  Final   Special Requests NONE  Final   Culture   Final    NO GROWTH 1 DAY Performed at [facility]    Report Status 11/10/2015 FINAL  Final     Studies: Dg Chest Port 1 View  11/10/2015  CLINICAL  DATA:  Cough.  Pneumonia. EXAM: PORTABLE CHEST 1 VIEW COMPARISON:  November 08, 2015. FINDINGS: Stable cardiomegaly. Stable large airspace opacity is noted in left lung. Increased right basilar opacity is noted concerning for pneumonia or atelectasis. No definite pneumothorax is noted. Visualized portion of bony thorax is unremarkable. IMPRESSION: Stable large left airspace opacity is noted concerning for pneumonia, but continued radiographic follow-up is recommended to rule out underlying neoplasm. Increased right basilar opacity is noted concerning for developing pneumonia or atelectasis. Electronically Signed   By: Lupita Raider, M.D.   On: 11/10/2015 11:59    Scheduled Meds: . amLODipine  10 mg Oral BID  . aspirin EC  81 mg Oral Daily  . azithromycin  500 mg Oral Q24 Hr x 5  . cefTRIAXone (ROCEPHIN)  IV  2 g Intravenous Q24H  . enoxaparin (LOVENOX) injection  40 mg Subcutaneous Q24H  . guaiFENesin  1,200 mg Oral BID  . lisinopril  20 mg Oral Daily   And  . hydrochlorothiazide  12.5 mg Oral Daily  . ipratropium-albuterol  3 mL Nebulization QID  . methylPREDNISolone (SOLU-MEDROL) injection  40 mg Intravenous Q12H  . metoprolol  100 mg Oral BID  . milk and molasses  1 enema Rectal Once  . QUEtiapine  200 mg Oral QHS  . sodium chloride flush  3 mL Intravenous Q12H  . spironolactone  25 mg Oral Daily   Continuous Infusions: . sodium chloride 125 mL/hr at 11/10/15 1610    Active Problems:   Bipolar 1 disorder (HCC)   HTN  (hypertension)   Hyperlipidemia   CAP (community acquired pneumonia)   Acute respiratory failure with hypoxia (HCC)    Elecia Serafin K  [facility] Pager [phone]. If 7PM-7AM, please contact night-coverage at [email or web address], password [facility] 11/10/2015, 1:41 PM  LOS: 2 days

## 2015-11-11 ENCOUNTER — Telehealth: Payer: Self-pay | Admitting: Physician Assistant

## 2015-11-11 LAB — CULTURE, BLOOD (ROUTINE X 2)

## 2015-11-11 LAB — BASIC METABOLIC PANEL
ANION GAP: 7 (ref 5–15)
BUN: 9 mg/dL (ref 6–20)
CO2: 31 mmol/L (ref 22–32)
Calcium: 8.7 mg/dL — ABNORMAL LOW (ref 8.9–10.3)
Chloride: 107 mmol/L (ref 101–111)
Creatinine, Ser: 0.84 mg/dL (ref 0.44–1.00)
Glucose, Bld: 161 mg/dL — ABNORMAL HIGH (ref 65–99)
POTASSIUM: 3.9 mmol/L (ref 3.5–5.1)
SODIUM: 145 mmol/L (ref 135–145)

## 2015-11-11 LAB — CBC
HEMATOCRIT: 31.8 % — AB (ref 36.0–46.0)
HEMOGLOBIN: 9.9 g/dL — AB (ref 12.0–15.0)
MCH: 28.4 pg (ref 26.0–34.0)
MCHC: 31.1 g/dL (ref 30.0–36.0)
MCV: 91.4 fL (ref 78.0–100.0)
Platelets: 379 10*3/uL (ref 150–400)
RBC: 3.48 MIL/uL — ABNORMAL LOW (ref 3.87–5.11)
RDW: 14 % (ref 11.5–15.5)
WBC: 7.1 10*3/uL (ref 4.0–10.5)

## 2015-11-11 MED ORDER — MAGNESIUM CITRATE PO SOLN
1.0000 | Freq: Once | ORAL | Status: AC
  Administered 2015-11-11: 1 via ORAL
  Filled 2015-11-11: qty 296

## 2015-11-11 MED ORDER — FUROSEMIDE 10 MG/ML IJ SOLN
40.0000 mg | Freq: Once | INTRAMUSCULAR | Status: AC
  Administered 2015-11-11: 40 mg via INTRAVENOUS
  Filled 2015-11-11: qty 4

## 2015-11-11 NOTE — Progress Notes (Signed)
[facility] PROGRESS NOTE  Catherine Farrell ZOX:[id number] DOB: 02-04-1956 DOA: 11/08/2015 PCP: JEFFERY,CHELLE, PA-C  HPI/Brief narrative 60 y.o. female who recently underwent a cholecystectomy on 1/27 and was discharged home the same day. She reports after returning home she had worsening abdominal pain, nausea and vomiting. Imaging demonstrated a dense L sided pneumonia.  Assessment/Plan: 1. Community acquired pneumonia without sepsis on admission. Patient was in the hospital on 1/27 but was discharged same day. Currently treated as community-acquired pneumonia. Check urinary antigen, pending. Patient is continued on Rocephin and azithromycin. Continue nebulizer treatments, mucolytics and pulmonary hygiene. Patient's O2 requirements remain elevated at around 5-6LNC. Given increased O2 requirements,  consulted Pulmonary for further assistance. Recs noted for scheduled steroids. Cont current abx for now 2. Acute hypoxic respiratory failure related to pneumonia. O2 requirements now up to 5-6L from 4L on admit. We'll try to wean down oxygen as tolerated. Per above. Give trial of lasix as pt had been on 125cc/hr 3. Hypertension. Stable. Continue outpatient regimen. 4. Hyperlipidemia. Continue statin. 5. Bipolar disorder. Will continue on Seroquel. 6. Nausea and vomiting. Possibly related to constipation. Cont cathartics as tolerated  Code Status: Full Family Communication: Pt in room Disposition Plan: anticipate d/c home when on min O2 support and off IV meds   Consultants:  Pulmonary  Procedures:    Antibiotics: Anti-infectives    Start     Dose/Rate Route Frequency Ordered Stop   11/10/15 1800  cefTRIAXone (ROCEPHIN) 2 g in dextrose 5 % 50 mL IVPB     2 g 100 mL/hr over 30 Minutes Intravenous Every 24 hours 11/09/15 1814 11/15/15 1759   11/10/15 1800  azithromycin (ZITHROMAX) tablet 500 mg     500 mg Oral Every 24 hrs x 5 11/10/15 1320 11/15/15 1759   11/08/15 1830   azithromycin (ZITHROMAX) 500 mg in dextrose 5 % 250 mL IVPB  Status:  Discontinued     500 mg 250 mL/hr over 60 Minutes Intravenous Every 24 hours 11/08/15 1732 11/10/15 1320   11/08/15 1800  vancomycin (VANCOCIN) IVPB 1000 mg/200 mL premix  Status:  Discontinued     1,000 mg 200 mL/hr over 60 Minutes Intravenous Every 12 hours 11/08/15 1059 11/08/15 1732   11/08/15 1800  cefTRIAXone (ROCEPHIN) 1 g in dextrose 5 % 50 mL IVPB  Status:  Discontinued     1 g 100 mL/hr over 30 Minutes Intravenous Every 24 hours 11/08/15 1732 11/09/15 1814   11/08/15 1700  ceFEPIme (MAXIPIME) 1 g in dextrose 5 % 50 mL IVPB  Status:  Discontinued     1 g 100 mL/hr over 30 Minutes Intravenous Every 8 hours 11/08/15 1057 11/08/15 1732   11/08/15 0800  ceFEPIme (MAXIPIME) 2 g in dextrose 5 % 50 mL IVPB     2 g 100 mL/hr over 30 Minutes Intravenous  Once 11/08/15 0745 11/08/15 0858   11/08/15 0800  vancomycin (VANCOCIN) IVPB 1000 mg/200 mL premix     1,000 mg 200 mL/hr over 60 Minutes Intravenous  Once 11/08/15 0745 11/08/15 0953      HPI/Subjective: Complaining of constipation  Objective: Filed Vitals:   11/11/15 0618 11/11/15 0723 11/11/15 1127 11/11/15 1424  BP: 139/69   164/82  Pulse: 81   71  Temp: 97.8 F (36.6 C)   98.6 F (37 C)  TempSrc: Oral   Oral  Resp: 18   20  Height:      Weight:      SpO2: 90% 95% 88% 97%  Intake/Output Summary (Last 24 hours) at 11/11/15 1441 Last data filed at 11/11/15 1425  Gross per 24 hour  Intake   2270 ml  Output   2450 ml  Net   -180 ml   Filed Weights   11/08/15 0800 11/08/15 1105 11/08/15 1109  Weight: 79.379 kg (175 lb) 79.379 kg (175 lb) 102.241 kg (225 lb 6.4 oz)    Exam:   General:  Awake, in nad, laying in bed  Cardiovascular: regular, s1, s2  Respiratory: normal resp effort, decreased BS on L  Abdomen: soft, nondistended, pos BS  Musculoskeletal: perfused, no cyanosis  Data Reviewed: Basic Metabolic Panel:  Recent Labs Lab  11/08/15 0700 11/09/15 0645 11/10/15 0531 11/11/15 0603  NA 143 146* 145 145  K 3.7 3.6 3.6 3.9  CL 105 109 106 107  CO2 GLUCOSE 163* 118* 103* 161*  BUN CREATININE 0.90 0.80 0.79 0.84  CALCIUM 8.9 7.7* 7.7* 8.7*   Liver Function Tests:  Recent Labs Lab 11/08/15 0700 11/09/15 0645  AST 27 25  ALT 37 37  ALKPHOS 77 70  BILITOT 0.9 0.4  PROT 7.0 6.1*  ALBUMIN 3.2* 2.6*    Recent Labs Lab 11/08/15 0700  LIPASE 16   No results for input(s): AMMONIA in the last 168 hours. CBC:  Recent Labs Lab 11/08/15 0700 11/09/15 0645 11/10/15 0531 11/11/15 0603  WBC 17.2* 7.4 6.1 7.1  HGB 10.1* 9.0* 8.8* 9.9*  HCT 31.8* 28.7* 28.2* 31.8*  MCV 92.2 93.5 92.2 91.4  PLT 296 247 258 379   Cardiac Enzymes:  Recent Labs Lab 11/08/15 0700  TROPONINI 0.05*   BNP (last 3 results) No results for input(s): BNP in the last 8760 hours.  ProBNP (last 3 results) No results for input(s): PROBNP in the last 8760 hours.  CBG: No results for input(s): GLUCAP in the last 168 hours.  Recent Results (from the past 240 hour(s))  Blood Culture (routine x 2)     Status: None (Preliminary result)   Collection Time: 11/08/15  8:18 AM  Result Value Ref Range Status   Specimen Description BLOOD RIGHT ANTECUBITAL DRAWN BY RN  Final   Special Requests   Final    BOTTLES DRAWN AEROBIC AND ANAEROBIC AEB=8CC ANA=12CC   Culture NO GROWTH 3 DAYS  Final   Report Status PENDING  Incomplete  Blood Culture (routine x 2)     Status: None   Collection Time: 11/08/15  8:20 AM  Result Value Ref Range Status   Specimen Description BLOOD RIGHT ANTECUBITAL  Final   Special Requests BOTTLES DRAWN AEROBIC AND ANAEROBIC 10CC EACH  Final   Culture  Setup Time   Final    GRAM POSITIVE COCCI IN PAIRS RECOVERED FROM THE ANAEROBIC BOTTLE Gram Stain Report Called to,Read Back By and Verified With: EVERETT,R. AT 1754 ON 11/09/2015 BY BAUGHAM,M. Performed at [facility]     Culture   Final    STREPTOCOCCUS PNEUMONIAE Performed at [facility]    Report Status 11/11/2015 FINAL  Final   Organism ID, Bacteria STREPTOCOCCUS PNEUMONIAE  Final      Susceptibility   Streptococcus pneumoniae - MIC*    ERYTHROMYCIN <=0.12 SENSITIVE Sensitive     LEVOFLOXACIN 0.5 SENSITIVE Sensitive     VANCOMYCIN <=0.12 SENSITIVE Sensitive     PENICILLIN <=0.06 SENSITIVE Sensitive     CEFTRIAXONE <=0.12 SENSITIVE Sensitive     * STREPTOCOCCUS PNEUMONIAE  Urine culture  Status: None   Collection Time: 11/09/15 12:00 PM  Result Value Ref Range Status   Specimen Description URINE, CLEAN CATCH  Final   Special Requests NONE  Final   Culture   Final    NO GROWTH 1 DAY Performed at [facility]    Report Status 11/10/2015 FINAL  Final     Studies: Dg Chest Port 1 View  11/10/2015  CLINICAL DATA:  Cough.  Pneumonia. EXAM: PORTABLE CHEST 1 VIEW COMPARISON:  November 08, 2015. FINDINGS: Stable cardiomegaly. Stable large airspace opacity is noted in left lung. Increased right basilar opacity is noted concerning for pneumonia or atelectasis. No definite pneumothorax is noted. Visualized portion of bony thorax is unremarkable. IMPRESSION: Stable large left airspace opacity is noted concerning for pneumonia, but continued radiographic follow-up is recommended to rule out underlying neoplasm. Increased right basilar opacity is noted concerning for developing pneumonia or atelectasis. Electronically Signed   By: Lupita Raider, M.D.   On: 11/10/2015 11:59    Scheduled Meds: . amLODipine  10 mg Oral BID  . aspirin EC  81 mg Oral Daily  . azithromycin  500 mg Oral Q24 Hr x 5  . cefTRIAXone (ROCEPHIN)  IV  2 g Intravenous Q24H  . enoxaparin (LOVENOX) injection  40 mg Subcutaneous Q24H  . guaiFENesin  1,200 mg Oral BID  . lisinopril  20 mg Oral Daily   And  . hydrochlorothiazide  12.5 mg Oral Daily  . ipratropium-albuterol  3 mL Nebulization QID  . methylPREDNISolone  (SOLU-MEDROL) injection  40 mg Intravenous Q12H  . metoprolol  100 mg Oral BID  . milk and molasses  1 enema Rectal Once  . polyethylene glycol  17 g Oral Daily  . QUEtiapine  200 mg Oral QHS  . sodium chloride flush  3 mL Intravenous Q12H  . spironolactone  25 mg Oral Daily   Continuous Infusions:    Active Problems:   Bipolar 1 disorder (HCC)   HTN (hypertension)   Hyperlipidemia   CAP (community acquired pneumonia)   Acute respiratory failure with hypoxia (HCC)    Mamoudou Mulvehill K  [facility] Pager [phone]. If 7PM-7AM, please contact night-coverage at [email or web address], password [facility] 11/11/2015, 2:41 PM  LOS: 3 days

## 2015-11-11 NOTE — Care Management Important Message (Signed)
Important Message  Patient Details  Name: Catherine Farrell MRN: [id number] Date of Birth: Sep 10, 1956   Medicare Important Message Given:  Yes    Adonis Huguenin, RN 11/11/2015, 8:45 AM

## 2015-11-11 NOTE — Care Management Note (Signed)
Case Management Note  Patient Details  Name: Catherine Farrell MRN: [id number] Date of Birth: 1956/06/22  Subjective/Objective:             Patient pleasant and oriented. She still is desaturating  But no underlying diagnosis for O2 at this time.   Discussed with attending who stated that patient would need to be diuresed more. Likely will not discharge over the weekend.      Action/Plan:  Home with self care  Expected Discharge Date:                  Expected Discharge Plan:  Home/Self Care  In-House Referral:     Discharge [place] not met per provider  Post Acute Care Choice:    Choice offered to:     DME Arranged:    DME Agency:     HH Arranged:    HH Agency:     Status of Service:  In process, will continue to follow  Medicare Important Message Given:  Yes Date Medicare IM Given:    Medicare IM give by:    Date Additional Medicare IM Given:    Additional Medicare Important Message give by:     If discussed at [place] of Stay Meetings, dates discussed:    Additional Comments:  Alvie Heidelberg, RN 11/11/2015, 2:29 PM

## 2015-11-11 NOTE — Telephone Encounter (Signed)
Pt called to advise PCP that she has been admitted to the hospital with pneumonia, she states she has sent some myChart messages and would like consultation.  Mammography rescheduled to 11/25/15  [phone]

## 2015-11-11 NOTE — Progress Notes (Signed)
Patient informed me that she had a large BM after her Mag citrate and myra lax today.

## 2015-11-12 MED ORDER — PANTOPRAZOLE SODIUM 40 MG PO TBEC
40.0000 mg | DELAYED_RELEASE_TABLET | Freq: Every day | ORAL | Status: DC
  Administered 2015-11-12 – 2015-11-14 (×3): 40 mg via ORAL
  Filled 2015-11-12 (×3): qty 1

## 2015-11-12 MED ORDER — IPRATROPIUM-ALBUTEROL 0.5-2.5 (3) MG/3ML IN SOLN
3.0000 mL | Freq: Three times a day (TID) | RESPIRATORY_TRACT | Status: DC
  Administered 2015-11-13 – 2015-11-14 (×3): 3 mL via RESPIRATORY_TRACT
  Filled 2015-11-12 (×4): qty 3

## 2015-11-12 MED ORDER — PROMETHAZINE HCL 25 MG/ML IJ SOLN: 12.5000 mg | Freq: Once | INTRAMUSCULAR | Status: AC

## 2015-11-12 MED ORDER — CALCIUM CARBONATE ANTACID 500 MG PO CHEW
1.0000 | CHEWABLE_TABLET | Freq: Four times a day (QID) | ORAL | Status: DC | PRN
  Administered 2015-11-12 (×3): 200 mg via ORAL
  Filled 2015-11-12 (×3): qty 1

## 2015-11-12 NOTE — Progress Notes (Signed)
[facility] PROGRESS NOTE  Zameria Vogl MVH:[id number] DOB: 1956/01/26 DOA: 11/08/2015 PCP: JEFFERY,CHELLE, PA-C  HPI/Brief narrative 60 y.o. female who recently underwent a cholecystectomy on 1/27 and was discharged home the same day. She reports after returning home she had worsening abdominal pain, nausea and vomiting. Imaging demonstrated a dense L sided pneumonia.  Assessment/Plan: 1. Community acquired pneumonia without sepsis on admission. Patient was in the hospital on 1/27 but was discharged same day. Currently treated as community-acquired pneumonia. Check urinary antigen, pending. Patient is continued on Rocephin and azithromycin. Continue nebulizer treatments, mucolytics and pulmonary hygiene. Patient's O2 requirements had remained elevated at around 5-6LNC. Given increased O2 requirements, consulted Pulmonary for further assistance with recs for scheduled steroids noted. Cont current abx for now. This AM, pt reports feeling better. O2 now being weaned 2. Acute hypoxic respiratory failure related to pneumonia. O2 requirements recently up to 5-6L from 4L on admit. Per above. Given trial of lasix as pt had been on 125cc/hr 3. Hypertension. Stable. Continue outpatient regimen. 4. Hyperlipidemia. Continue statin. 5. Bipolar disorder. Will continue on Seroquel. 6. Nausea and vomiting. Possibly related to constipation. Cont cathartics as tolerated  Code Status: Full Family Communication: Pt in room Disposition Plan: anticipate d/c home when on min O2 support and off IV meds   Consultants:  Pulmonary  Procedures:    Antibiotics: Anti-infectives    Start     Dose/Rate Route Frequency Ordered Stop   11/10/15 1800  cefTRIAXone (ROCEPHIN) 2 g in dextrose 5 % 50 mL IVPB     2 g 100 mL/hr over 30 Minutes Intravenous Every 24 hours 11/09/15 1814 11/15/15 1759   11/10/15 1800  azithromycin (ZITHROMAX) tablet 500 mg     500 mg Oral Every 24 hrs x 5 11/10/15 1320 11/15/15  1759   11/08/15 1830  azithromycin (ZITHROMAX) 500 mg in dextrose 5 % 250 mL IVPB  Status:  Discontinued     500 mg 250 mL/hr over 60 Minutes Intravenous Every 24 hours 11/08/15 1732 11/10/15 1320   11/08/15 1800  vancomycin (VANCOCIN) IVPB 1000 mg/200 mL premix  Status:  Discontinued     1,000 mg 200 mL/hr over 60 Minutes Intravenous Every 12 hours 11/08/15 1059 11/08/15 1732   11/08/15 1800  cefTRIAXone (ROCEPHIN) 1 g in dextrose 5 % 50 mL IVPB  Status:  Discontinued     1 g 100 mL/hr over 30 Minutes Intravenous Every 24 hours 11/08/15 1732 11/09/15 1814   11/08/15 1700  ceFEPIme (MAXIPIME) 1 g in dextrose 5 % 50 mL IVPB  Status:  Discontinued     1 g 100 mL/hr over 30 Minutes Intravenous Every 8 hours 11/08/15 1057 11/08/15 1732   11/08/15 0800  ceFEPIme (MAXIPIME) 2 g in dextrose 5 % 50 mL IVPB     2 g 100 mL/hr over 30 Minutes Intravenous  Once 11/08/15 0745 11/08/15 0858   11/08/15 0800  vancomycin (VANCOCIN) IVPB 1000 mg/200 mL premix     1,000 mg 200 mL/hr over 60 Minutes Intravenous  Once 11/08/15 0745 11/08/15 0953      HPI/Subjective: Reports feeling better after multiple large bowel movements overnight  Objective: Filed Vitals:   11/11/15 2141 11/12/15 0554 11/12/15 0721 11/12/15 1157  BP: 166/76 146/82    Pulse: 71 68    Temp: 98.5 F (36.9 C) 98.4 F (36.9 C)    TempSrc: Oral Oral    Resp: 20 20    Height:      Weight:  SpO2: 95% 91% 96% 94%    Intake/Output Summary (Last 24 hours) at 11/12/15 1217 Last data filed at 11/12/15 0941  Gross per 24 hour  Intake    480 ml  Output   1850 ml  Net  -1370 ml   Filed Weights   11/08/15 0800 11/08/15 1105 11/08/15 1109  Weight: 79.379 kg (175 lb) 79.379 kg (175 lb) 102.241 kg (225 lb 6.4 oz)    Exam:   General:  Awake, in nad, laying in bed  Cardiovascular: regular, s1, s2  Respiratory: normal resp effort, decreased BS on L  Abdomen: soft, nondistended, pos BS  Musculoskeletal: perfused, no  cyanosis, no clubbing or edema  Data Reviewed: Basic Metabolic Panel:  Recent Labs Lab 11/08/15 0700 11/09/15 0645 11/10/15 0531 11/11/15 0603  NA 143 146* 145 145  K 3.7 3.6 3.6 3.9  CL 105 109 106 107  CO2 GLUCOSE 163* 118* 103* 161*  BUN CREATININE 0.90 0.80 0.79 0.84  CALCIUM 8.9 7.7* 7.7* 8.7*   Liver Function Tests:  Recent Labs Lab 11/08/15 0700 11/09/15 0645  AST 27 25  ALT 37 37  ALKPHOS 77 70  BILITOT 0.9 0.4  PROT 7.0 6.1*  ALBUMIN 3.2* 2.6*    Recent Labs Lab 11/08/15 0700  LIPASE 16   No results for input(s): AMMONIA in the last 168 hours. CBC:  Recent Labs Lab 11/08/15 0700 11/09/15 0645 11/10/15 0531 11/11/15 0603  WBC 17.2* 7.4 6.1 7.1  HGB 10.1* 9.0* 8.8* 9.9*  HCT 31.8* 28.7* 28.2* 31.8*  MCV 92.2 93.5 92.2 91.4  PLT 296 247 258 379   Cardiac Enzymes:  Recent Labs Lab 11/08/15 0700  TROPONINI 0.05*   BNP (last 3 results) No results for input(s): BNP in the last 8760 hours.  ProBNP (last 3 results) No results for input(s): PROBNP in the last 8760 hours.  CBG: No results for input(s): GLUCAP in the last 168 hours.  Recent Results (from the past 240 hour(s))  Blood Culture (routine x 2)     Status: None (Preliminary result)   Collection Time: 11/08/15  8:18 AM  Result Value Ref Range Status   Specimen Description BLOOD RIGHT ANTECUBITAL DRAWN BY RN  Final   Special Requests   Final    BOTTLES DRAWN AEROBIC AND ANAEROBIC AEB=8CC ANA=12CC   Culture NO GROWTH 3 DAYS  Final   Report Status PENDING  Incomplete  Blood Culture (routine x 2)     Status: None   Collection Time: 11/08/15  8:20 AM  Result Value Ref Range Status   Specimen Description BLOOD RIGHT ANTECUBITAL  Final   Special Requests BOTTLES DRAWN AEROBIC AND ANAEROBIC 10CC EACH  Final   Culture  Setup Time   Final    GRAM POSITIVE COCCI IN PAIRS RECOVERED FROM THE ANAEROBIC BOTTLE Gram Stain Report Called to,Read Back By and Verified  With: EVERETT,R. AT 1754 ON 11/09/2015 BY BAUGHAM,M. Performed at [facility]    Culture   Final    STREPTOCOCCUS PNEUMONIAE Performed at [facility]    Report Status 11/11/2015 FINAL  Final   Organism ID, Bacteria STREPTOCOCCUS PNEUMONIAE  Final      Susceptibility   Streptococcus pneumoniae - MIC*    ERYTHROMYCIN <=0.12 SENSITIVE Sensitive     LEVOFLOXACIN 0.5 SENSITIVE Sensitive     VANCOMYCIN <=0.12 SENSITIVE Sensitive     PENICILLIN <=0.06 SENSITIVE Sensitive     CEFTRIAXONE <=0.12  SENSITIVE Sensitive     * STREPTOCOCCUS PNEUMONIAE  Urine culture     Status: None   Collection Time: 11/09/15 12:00 PM  Result Value Ref Range Status   Specimen Description URINE, CLEAN CATCH  Final   Special Requests NONE  Final   Culture   Final    NO GROWTH 1 DAY Performed at [facility]    Report Status 11/10/2015 FINAL  Final     Studies: No results found.  Scheduled Meds: . amLODipine  10 mg Oral BID  . aspirin EC  81 mg Oral Daily  . azithromycin  500 mg Oral Q24 Hr x 5  . cefTRIAXone (ROCEPHIN)  IV  2 g Intravenous Q24H  . enoxaparin (LOVENOX) injection  40 mg Subcutaneous Q24H  . guaiFENesin  1,200 mg Oral BID  . lisinopril  20 mg Oral Daily   And  . hydrochlorothiazide  12.5 mg Oral Daily  . ipratropium-albuterol  3 mL Nebulization TID  . methylPREDNISolone (SOLU-MEDROL) injection  40 mg Intravenous Q12H  . metoprolol  100 mg Oral BID  . milk and molasses  1 enema Rectal Once  . polyethylene glycol  17 g Oral Daily  . QUEtiapine  200 mg Oral QHS  . sodium chloride flush  3 mL Intravenous Q12H  . spironolactone  25 mg Oral Daily   Continuous Infusions:    Principal Problem:   Acute respiratory failure with hypoxia (HCC) Active Problems:   Bipolar 1 disorder (HCC)   HTN (hypertension)   Hyperlipidemia   CAP (community acquired pneumonia)    CHIU, STEPHEN K  [facility] Pager [phone]. If 7PM-7AM, please contact night-coverage at  [email or web address], password [facility] 11/12/2015, 12:17 PM  LOS: 4 days

## 2015-11-12 NOTE — Progress Notes (Signed)
Patient refused neb 

## 2015-11-13 LAB — CULTURE, BLOOD (ROUTINE X 2): CULTURE: NO GROWTH

## 2015-11-13 MED ORDER — FUROSEMIDE 10 MG/ML IJ SOLN
40.0000 mg | Freq: Once | INTRAMUSCULAR | Status: AC
  Administered 2015-11-13: 40 mg via INTRAVENOUS
  Filled 2015-11-13: qty 4

## 2015-11-13 MED ORDER — AZITHROMYCIN 250 MG PO TABS: ORAL_TABLET | ORAL | Status: DC

## 2015-11-13 MED ORDER — PREDNISONE 20 MG PO TABS
60.0000 mg | ORAL_TABLET | Freq: Every day | ORAL | Status: DC
  Administered 2015-11-14: 60 mg via ORAL
  Filled 2015-11-13: qty 3

## 2015-11-13 MED ORDER — PANTOPRAZOLE SODIUM 40 MG PO TBEC: 40.0000 mg | DELAYED_RELEASE_TABLET | Freq: Every day | ORAL | Status: DC

## 2015-11-13 MED ORDER — PREDNISONE 5 MG PO TABS: 5.0000 mg | ORAL_TABLET | Freq: Every day | ORAL | Status: DC

## 2015-11-13 MED ORDER — LEVOFLOXACIN 750 MG PO TABS
750.0000 mg | ORAL_TABLET | Freq: Every day | ORAL | Status: DC
  Administered 2015-11-13 – 2015-11-14 (×2): 750 mg via ORAL
  Filled 2015-11-13 (×2): qty 1

## 2015-11-13 MED ORDER — BENZONATATE 100 MG PO CAPS: 100.0000 mg | ORAL_CAPSULE | Freq: Three times a day (TID) | ORAL | Status: DC | PRN

## 2015-11-13 MED ORDER — ALBUTEROL SULFATE HFA 108 (90 BASE) MCG/ACT IN AERS: 1.0000 | INHALATION_SPRAY | RESPIRATORY_TRACT | Status: DC | PRN

## 2015-11-13 MED ORDER — CEFUROXIME AXETIL 250 MG PO TABS: 250.0000 mg | ORAL_TABLET | Freq: Two times a day (BID) | ORAL | Status: DC

## 2015-11-13 NOTE — Discharge Summary (Signed)
Physician Discharge Summary  Catherine Farrell ZOX:[id number] DOB: Dec 05, 1955 DOA: 11/08/2015  PCP: JEFFERY,CHELLE, PA-C  Admit date: 11/08/2015 Discharge date: 11/14/2015  Time spent: 20 minutes  Recommendations for Outpatient Follow-up:  1. Follow up with PCP in 1-2 weeks   Discharge Diagnoses:  Principal Problem:   Acute respiratory failure with hypoxia (HCC) Active Problems:   Bipolar 1 disorder (HCC)   HTN (hypertension)   Hyperlipidemia   CAP (community acquired pneumonia)   Discharge Condition: Improved  Diet recommendation: Heart healthy  Filed Weights   11/08/15 0800 11/08/15 1105 11/08/15 1109  Weight: 79.379 kg (175 lb) 79.379 kg (175 lb) 102.241 kg (225 lb 6.4 oz)    History of present illness:  Please review dictated H and P from 1/31 for details. Briefly, 60 y.o. female who recently underwent a cholecystectomy on 1/27 and was discharged home the same day. She reports after returning home she had worsening abdominal pain, nausea and vomiting. Imaging demonstrated a dense L sided pneumonia.  Hospital Course:  1. Community acquired pneumonia without sepsis on admission. Patient was in the hospital on 1/27 but was discharged on the same day. Patient was treated empirically for community-acquired pneumonia. Check urinary antigen, pending. Patient is continued on Rocephin and azithromycin. Patient was continued on nebulizer treatments, mucolytics and pulmonary hygiene. During this admission, patient's O2 requirements had worsened to around 5-6LNC. Given increased O2 requirements, Pulmonary was consulted with recs for scheduled steroid wean. Patient has reported much clinical improvement and has been successfully weaned from supplemental O2. Patient will complete course of PO levaquin on discharge. Follow up CXR demonstrated improvement in appearance of pneumonia 2. Acute hypoxic respiratory failure related to pneumonia. O2 requirements recently up to 5-6L from 4L on admit. Per  above. Patient was given a trial of lasix as pt had been on 125cc/hr. No LE edema and no other evidence suggestive of CHF 3. Hypertension. Stable. Continue outpatient regimen. 4. Hyperlipidemia. Continue statin. 5. Bipolar disorder. Will continue on Seroquel. 6. Nausea and vomiting. Possibly related to constipation. Resolved wtih cathartics  Consultations:  Pulmonary  Discharge Exam: Filed Vitals:   11/13/15 1405 11/13/15 1958 11/13/15 2126 11/14/15 0543  BP:   122/65 126/62  Pulse:  62 65 58  Temp:   98.2 F (36.8 C) 98 F (36.7 C)  TempSrc:   Oral Oral  Resp:  Height:      Weight:      SpO2: 95% 95% 96% 93%    General: awake, in nad Cardiovascular: regular, s1, s2 Respiratory: normal resp effort, no wheezing  Discharge Instructions     Medication List    TAKE these medications        albuterol 108 (90 Base) MCG/ACT inhaler  Commonly known as:  VENTOLIN HFA  Inhale 1-2 puffs into the lungs every 4 (four) hours as needed for wheezing or shortness of breath.     amLODipine 10 MG tablet  Commonly known as:  NORVASC  Take 10 mg by mouth 2 (two) times daily.     aspirin 81 MG tablet  Take 81 mg by mouth daily.     benzonatate 100 MG capsule  Commonly known as:  TESSALON  Take 1 capsule (100 mg total) by mouth 3 (three) times daily as needed for cough.     cyclobenzaprine 10 MG tablet  Commonly known as:  FLEXERIL  Take 1 tablet (10 mg total) by mouth 3 (three) times daily as needed for muscle spasms.  diazepam 10 MG tablet  Commonly known as:  VALIUM  TAKE ONE TABLET BY MOUTH EVERY SIX HOURS AS NEEDED FOR ANXIETY     levofloxacin 750 MG tablet  Commonly known as:  LEVAQUIN  Take 1 tablet (750 mg total) by mouth daily.     lisinopril-hydrochlorothiazide 20-12.5 MG tablet  Commonly known as:  PRINZIDE,ZESTORETIC  Take 1 tablet by mouth daily.     metoprolol 100 MG tablet  Commonly known as:  LOPRESSOR  Take 1 tablet (100 mg total) by mouth  2 (two) times daily.     OMEGA-3 FISH OIL PO  Take 1 capsule by mouth daily.     pantoprazole 40 MG tablet  Commonly known as:  PROTONIX  Take 1 tablet (40 mg total) by mouth daily.     predniSONE 5 MG tablet  Commonly known as:  DELTASONE  Take 1 tablet (5 mg total) by mouth daily with breakfast.     PROBIOTIC DAILY PO  Take 1 capsule by mouth daily.     QUEtiapine 200 MG tablet  Commonly known as:  SEROQUEL  Take 200 mg by mouth at bedtime.     spironolactone 25 MG tablet  Commonly known as:  ALDACTONE  Take 25 mg by mouth daily.       Allergies  Allergen Reactions  . Codeine Nausea Only  . Tramadol Hives and Rash      The results of significant diagnostics from this hospitalization (including imaging, microbiology, ancillary and laboratory) are listed below for reference.    Significant Diagnostic Studies: Ct Abdomen Pelvis W Contrast  11/08/2015  CLINICAL DATA:  Gallbladder removed Friday 09/03/2016. Right upper quadrant pain and distention. Shortness of breath. EXAM: CT ABDOMEN AND PELVIS WITH CONTRAST TECHNIQUE: Multidetector CT imaging of the abdomen and pelvis was performed using the standard protocol following bolus administration of intravenous contrast. CONTRAST:  OMNIPAQUE IOHEXOL 300 MG/ML  SOLN COMPARISON:  Ultrasound 09/30/2015 FINDINGS: Dense consolidation in the left lower lobe concerning for pneumonia. Small left pleural effusion. Heart is borderline in size. Diffuse fatty infiltration of the liver. Changes of cholecystectomy. No focal fluid collection in the gallbladder fossa. Spleen, pancreas, adrenals and kidneys are unremarkable. Postoperative stranding and gas noted in the region of the umbilicus. No fluid collection. Prior hysterectomy. No adnexal masses. Urinary bladder is unremarkable. Bowel grossly unremarkable. No free fluid, free air, or adenopathy. Aorta is normal caliber with scattered calcifications. No acute bony abnormality or focal bone  lesion. IMPRESSION: Dense consolidation in the left lower lobe with small left pleural effusion. Findings concerning for pneumonia. Fatty liver. Prior cholecystectomy without visible complicating feature. Electronically Signed   By: Charlett Nose M.D.   On: 11/08/2015 09:57   Dg Chest Port 1 View  11/14/2015  CLINICAL DATA:  Followup exam.  Pneumonia. EXAM: PORTABLE CHEST 1 VIEW COMPARISON:  11/10/2015 FINDINGS: Airspace opacity in the left perihilar region has decreased in extent when compared to the prior exam consistent with improvement. There are no new areas of lung consolidation. There is no evidence of pulmonary edema. No convincing pleural effusion or pneumothorax. Cardiac silhouette is normal in size. IMPRESSION: 1. Lung aeration has improved from the prior exam. There is less dense and less extensive left lung consolidation consistent with improved pneumonia. No new abnormalities. Electronically Signed   By: Amie Portland M.D.   On: 11/14/2015 11:34   Dg Chest Port 1 View  11/10/2015  CLINICAL DATA:  Cough.  Pneumonia. EXAM: PORTABLE CHEST 1  VIEW COMPARISON:  November 08, 2015. FINDINGS: Stable cardiomegaly. Stable large airspace opacity is noted in left lung. Increased right basilar opacity is noted concerning for pneumonia or atelectasis. No definite pneumothorax is noted. Visualized portion of bony thorax is unremarkable. IMPRESSION: Stable large left airspace opacity is noted concerning for pneumonia, but continued radiographic follow-up is recommended to rule out underlying neoplasm. Increased right basilar opacity is noted concerning for developing pneumonia or atelectasis. Electronically Signed   By: Lupita Raider, M.D.   On: 11/10/2015 11:59   Dg Chest Portable 1 View  11/08/2015  CLINICAL DATA:  Cough EXAM: PORTABLE CHEST 1 VIEW COMPARISON:  None. FINDINGS: Dense consolidation in the left lung. Very low lung volumes with right base atelectasis. Cardiomegaly with vascular congestion. No  acute bony abnormality. IMPRESSION: Dense consolidation throughout the left lung compatible with pneumonia. Low lung volumes. Right base atelectasis. Cardiomegaly, vascular congestion. Electronically Signed   By: Charlett Nose M.D.   On: 11/08/2015 07:50    Microbiology: Recent Results (from the past 240 hour(s))  Blood Culture (routine x 2)     Status: None   Collection Time: 11/08/15  8:18 AM  Result Value Ref Range Status   Specimen Description BLOOD RIGHT ANTECUBITAL DRAWN BY RN  Final   Special Requests   Final    BOTTLES DRAWN AEROBIC AND ANAEROBIC AEB=8CC ANA=12CC   Culture NO GROWTH 5 DAYS  Final   Report Status 11/13/2015 FINAL  Final  Blood Culture (routine x 2)     Status: None   Collection Time: 11/08/15  8:20 AM  Result Value Ref Range Status   Specimen Description BLOOD RIGHT ANTECUBITAL  Final   Special Requests BOTTLES DRAWN AEROBIC AND ANAEROBIC 10CC EACH  Final   Culture  Setup Time   Final    GRAM POSITIVE COCCI IN PAIRS RECOVERED FROM THE ANAEROBIC BOTTLE Gram Stain Report Called to,Read Back By and Verified With: EVERETT,R. AT 1754 ON 11/09/2015 BY BAUGHAM,M. Performed at [facility]    Culture   Final    STREPTOCOCCUS PNEUMONIAE Performed at [facility]    Report Status 11/11/2015 FINAL  Final   Organism ID, Bacteria STREPTOCOCCUS PNEUMONIAE  Final      Susceptibility   Streptococcus pneumoniae - MIC*    ERYTHROMYCIN <=0.12 SENSITIVE Sensitive     LEVOFLOXACIN 0.5 SENSITIVE Sensitive     VANCOMYCIN <=0.12 SENSITIVE Sensitive     PENICILLIN <=0.06 SENSITIVE Sensitive     CEFTRIAXONE <=0.12 SENSITIVE Sensitive     * STREPTOCOCCUS PNEUMONIAE  Urine culture     Status: None   Collection Time: 11/09/15 12:00 PM  Result Value Ref Range Status   Specimen Description URINE, CLEAN CATCH  Final   Special Requests NONE  Final   Culture   Final    NO GROWTH 1 DAY Performed at [facility]    Report Status 11/10/2015 FINAL  Final      Labs: Basic Metabolic Panel:  Recent Labs Lab 11/08/15 0700 11/09/15 0645 11/10/15 0531 11/11/15 0603 11/14/15 0611  NA 143 146* 145 145 141  K 3.7 3.6 3.6 3.9 3.9  CL 105 109 106 107 100*  CO2 29 30 29 31 31   GLUCOSE 163* 118* 103* 161* 93  BUN 13 13 7 9  29*  CREATININE 0.90 0.80 0.79 0.84 1.15*  CALCIUM 8.9 7.7* 7.7* 8.7* 8.8*   Liver Function Tests:  Recent Labs Lab 11/08/15 0700 11/09/15 0645  AST 27 25  ALT  37 37  ALKPHOS 77 70  BILITOT 0.9 0.4  PROT 7.0 6.1*  ALBUMIN 3.2* 2.6*    Recent Labs Lab 11/08/15 0700  LIPASE 16   No results for input(s): AMMONIA in the last 168 hours. CBC:  Recent Labs Lab 11/08/15 0700 11/09/15 0645 11/10/15 0531 11/11/15 0603  WBC 17.2* 7.4 6.1 7.1  HGB 10.1* 9.0* 8.8* 9.9*  HCT 31.8* 28.7* 28.2* 31.8*  MCV 92.2 93.5 92.2 91.4  PLT 296 247 258 379   Cardiac Enzymes:  Recent Labs Lab 11/08/15 0700  TROPONINI 0.05*   BNP: BNP (last 3 results) No results for input(s): BNP in the last 8760 hours.  ProBNP (last 3 results) No results for input(s): PROBNP in the last 8760 hours.  CBG: No results for input(s): GLUCAP in the last 168 hours.   Signed:  CHIU, STEPHEN K  [facility] 11/14/2015, 11:49 AM

## 2015-11-13 NOTE — Progress Notes (Signed)
[facility] PROGRESS NOTE  Catherine Farrell LKG:[id number] DOB: 1956-09-21 DOA: 11/08/2015 PCP: JEFFERY,CHELLE, PA-C  HPI/Brief narrative 60 y.o. female who recently underwent a cholecystectomy on 1/27 and was discharged home the same day. She reports after returning home she had worsening abdominal pain, nausea and vomiting. Imaging demonstrated a dense L sided pneumonia.  Assessment/Plan: 1. Community acquired pneumonia without sepsis on admission. Patient was in the hospital on 1/27 but was discharged on the same day. Patient was treated empirically for community-acquired pneumonia. Check urinary antigen, pending. Patient is continued on Rocephin and azithromycin. Patient was continued on nebulizer treatments, mucolytics and pulmonary hygiene. During this admission, patient's O2 requirements had worsened to around 5-6LNC. Given increased O2 requirements, Pulmonary was consulted with recs for scheduled steroid wean. Patient has reported much clinical improvement but still requires O2 (down to 88% on ambulation and upper-70's while talking on phone on RA) 2. Acute hypoxic respiratory failure related to pneumonia. O2 requirements recently up to 5-6L from 4L on admit. Per above. Patient was given a trial of lasix as pt had been on 125cc/hr. No LE edema and no other evidence suggestive of CHF 3. Hypertension. Stable. Continue outpatient regimen. 4. Hyperlipidemia. Continue statin. 5. Bipolar disorder. Will continue on Seroquel. 6. Nausea and vomiting. Possibly related to constipation. Resolved wtih cathartics  Code Status: Full Family Communication: Pt in room Disposition Plan: anticipate d/c home when on min O2 support and off IV meds, possibly in the next 24-48hrs   Consultants:  Pulmonary  Procedures:    Antibiotics: Anti-infectives    Start     Dose/Rate Route Frequency Ordered Stop   11/13/15 0000  azithromycin (ZITHROMAX) 250 MG tablet        11/13/15 1138     11/13/15  0000  cefUROXime (CEFTIN) 250 MG tablet     250 mg Oral 2 times daily with meals 11/13/15 1138     11/10/15 1800  cefTRIAXone (ROCEPHIN) 2 g in dextrose 5 % 50 mL IVPB     2 g 100 mL/hr over 30 Minutes Intravenous Every 24 hours 11/09/15 1814 11/15/15 1759   11/10/15 1800  azithromycin (ZITHROMAX) tablet 500 mg     500 mg Oral Every 24 hrs x 5 11/10/15 1320 11/15/15 1759   11/08/15 1830  azithromycin (ZITHROMAX) 500 mg in dextrose 5 % 250 mL IVPB  Status:  Discontinued     500 mg 250 mL/hr over 60 Minutes Intravenous Every 24 hours 11/08/15 1732 11/10/15 1320   11/08/15 1800  vancomycin (VANCOCIN) IVPB 1000 mg/200 mL premix  Status:  Discontinued     1,000 mg 200 mL/hr over 60 Minutes Intravenous Every 12 hours 11/08/15 1059 11/08/15 1732   11/08/15 1800  cefTRIAXone (ROCEPHIN) 1 g in dextrose 5 % 50 mL IVPB  Status:  Discontinued     1 g 100 mL/hr over 30 Minutes Intravenous Every 24 hours 11/08/15 1732 11/09/15 1814   11/08/15 1700  ceFEPIme (MAXIPIME) 1 g in dextrose 5 % 50 mL IVPB  Status:  Discontinued     1 g 100 mL/hr over 30 Minutes Intravenous Every 8 hours 11/08/15 1057 11/08/15 1732   11/08/15 0800  ceFEPIme (MAXIPIME) 2 g in dextrose 5 % 50 mL IVPB     2 g 100 mL/hr over 30 Minutes Intravenous  Once 11/08/15 0745 11/08/15 0858   11/08/15 0800  vancomycin (VANCOCIN) IVPB 1000 mg/200 mL premix     1,000 mg 200 mL/hr over 60 Minutes Intravenous  Once 11/08/15  0745 11/08/15 0953      HPI/Subjective: Very eager to go home today. States feeling much better  Objective: Filed Vitals:   11/12/15 1520 11/12/15 2205 11/13/15 0653 11/13/15 1044  BP: 118/80 146/67 136/69 141/81  Pulse: 69 64 62 65  Temp: 98.5 F (36.9 C) 98.6 F (37 C) 98.2 F (36.8 C) 98.2 F (36.8 C)  TempSrc: Oral Oral Oral Oral  Resp: Height:      Weight:      SpO2: 94% 98% 97% 96%    Intake/Output Summary (Last 24 hours) at 11/13/15 1151 Last data filed at 11/13/15 1000  Gross per 24  hour  Intake    483 ml  Output   1000 ml  Net   -517 ml   Filed Weights   11/08/15 0800 11/08/15 1105 11/08/15 1109  Weight: 79.379 kg (175 lb) 79.379 kg (175 lb) 102.241 kg (225 lb 6.4 oz)    Exam:   General:  Awake, sitting in bed, in nad  Cardiovascular: regular, s1, s2  Respiratory: normal resp effort, decreased BS on L  Abdomen: soft, nondistended, pos BS  Musculoskeletal: perfused, no cyanosis, no edema  Data Reviewed: Basic Metabolic Panel:  Recent Labs Lab 11/08/15 0700 11/09/15 0645 11/10/15 0531 11/11/15 0603  NA 143 146* 145 145  K 3.7 3.6 3.6 3.9  CL 105 109 106 107  CO2 GLUCOSE 163* 118* 103* 161*  BUN CREATININE 0.90 0.80 0.79 0.84  CALCIUM 8.9 7.7* 7.7* 8.7*   Liver Function Tests:  Recent Labs Lab 11/08/15 0700 11/09/15 0645  AST 27 25  ALT 37 37  ALKPHOS 77 70  BILITOT 0.9 0.4  PROT 7.0 6.1*  ALBUMIN 3.2* 2.6*    Recent Labs Lab 11/08/15 0700  LIPASE 16   No results for input(s): AMMONIA in the last 168 hours. CBC:  Recent Labs Lab 11/08/15 0700 11/09/15 0645 11/10/15 0531 11/11/15 0603  WBC 17.2* 7.4 6.1 7.1  HGB 10.1* 9.0* 8.8* 9.9*  HCT 31.8* 28.7* 28.2* 31.8*  MCV 92.2 93.5 92.2 91.4  PLT 296 247 258 379   Cardiac Enzymes:  Recent Labs Lab 11/08/15 0700  TROPONINI 0.05*   BNP (last 3 results) No results for input(s): BNP in the last 8760 hours.  ProBNP (last 3 results) No results for input(s): PROBNP in the last 8760 hours.  CBG: No results for input(s): GLUCAP in the last 168 hours.  Recent Results (from the past 240 hour(s))  Blood Culture (routine x 2)     Status: None   Collection Time: 11/08/15  8:18 AM  Result Value Ref Range Status   Specimen Description BLOOD RIGHT ANTECUBITAL DRAWN BY RN  Final   Special Requests   Final    BOTTLES DRAWN AEROBIC AND ANAEROBIC AEB=8CC ANA=12CC   Culture NO GROWTH 5 DAYS  Final   Report Status 11/13/2015 FINAL  Final  Blood Culture  (routine x 2)     Status: None   Collection Time: 11/08/15  8:20 AM  Result Value Ref Range Status   Specimen Description BLOOD RIGHT ANTECUBITAL  Final   Special Requests BOTTLES DRAWN AEROBIC AND ANAEROBIC 10CC EACH  Final   Culture  Setup Time   Final    GRAM POSITIVE COCCI IN PAIRS RECOVERED FROM THE ANAEROBIC BOTTLE Gram Stain Report Called to,Read Back By and Verified With: EVERETT,R. AT 1754 ON 11/09/2015 BY BAUGHAM,M. Performed at [facility]    Culture   Final    STREPTOCOCCUS PNEUMONIAE Performed at [facility]    Report Status 11/11/2015 FINAL  Final   Organism ID, Bacteria STREPTOCOCCUS PNEUMONIAE  Final      Susceptibility   Streptococcus pneumoniae - MIC*    ERYTHROMYCIN <=0.12 SENSITIVE Sensitive     LEVOFLOXACIN 0.5 SENSITIVE Sensitive     VANCOMYCIN <=0.12 SENSITIVE Sensitive     PENICILLIN <=0.06 SENSITIVE Sensitive     CEFTRIAXONE <=0.12 SENSITIVE Sensitive     * STREPTOCOCCUS PNEUMONIAE  Urine culture     Status: None   Collection Time: 11/09/15 12:00 PM  Result Value Ref Range Status   Specimen Description URINE, CLEAN CATCH  Final   Special Requests NONE  Final   Culture   Final    NO GROWTH 1 DAY Performed at [facility]    Report Status 11/10/2015 FINAL  Final     Studies: No results found.  Scheduled Meds: . amLODipine  10 mg Oral BID  . aspirin EC  81 mg Oral Daily  . azithromycin  500 mg Oral Q24 Hr x 5  . cefTRIAXone (ROCEPHIN)  IV  2 g Intravenous Q24H  . enoxaparin (LOVENOX) injection  40 mg Subcutaneous Q24H  . guaiFENesin  1,200 mg Oral BID  . lisinopril  20 mg Oral Daily   And  . hydrochlorothiazide  12.5 mg Oral Daily  . ipratropium-albuterol  3 mL Nebulization TID  . methylPREDNISolone (SOLU-MEDROL) injection  40 mg Intravenous Q12H  . metoprolol  100 mg Oral BID  . milk and molasses  1 enema Rectal Once  . pantoprazole  40 mg Oral Daily  . polyethylene glycol  17 g Oral Daily  . promethazine  12.5 mg  Intravenous Once  . QUEtiapine  200 mg Oral QHS  . sodium chloride flush  3 mL Intravenous Q12H  . spironolactone  25 mg Oral Daily   Continuous Infusions:    Principal Problem:   Acute respiratory failure with hypoxia (HCC) Active Problems:   Bipolar 1 disorder (HCC)   HTN (hypertension)   Hyperlipidemia   CAP (community acquired pneumonia)    CHIU, STEPHEN K  [facility] Pager [phone]. If 7PM-7AM, please contact night-coverage at [email or web address], password [facility] 11/13/2015, 11:51 AM  LOS: 5 days

## 2015-11-13 NOTE — Progress Notes (Signed)
Ambulated in hall to evaluate oxygen requirements for home use per Dr. Rhona Leavens sitting at nurse's station.  O2 with 2L at rest 96%, O2 Room Air at rest 96%, Ambulating in hall on room air 88-92%, returned to room and O2 returned to 96% at rest on Room Air.  Dr. Red Christians to room, talked with patient about staying another day, ordered Lasix, will continue to monitor oxygen needs.

## 2015-11-13 NOTE — Progress Notes (Signed)
Pt complaining of nausea and vomiting. States it is the solu-medrol that is causing it. Zofran was given and per pt it did not help. MD notified and an order for Phenergan was placed. Pt states she did not want the phenergan.

## 2015-11-13 NOTE — Progress Notes (Signed)
SATURATION QUALIFICATIONS: (This note is used to comply with regulatory documentation for home oxygen)  Patient Saturations on Room Air at Rest = 96%  Patient Saturations on Room Air while Ambulating = 88-92%  Patient Saturations on 0 Liters of oxygen while Ambulating = 92%  Please briefly explain why patient needs home oxygen: Patient was talking while ambulating, Dr. Rhona Leavens to room and ordered  IV Lasix

## 2015-11-14 ENCOUNTER — Inpatient Hospital Stay (HOSPITAL_COMMUNITY): Payer: Medicare Other

## 2015-11-14 LAB — BASIC METABOLIC PANEL
ANION GAP: 10 (ref 5–15)
BUN: 29 mg/dL — ABNORMAL HIGH (ref 6–20)
CALCIUM: 8.8 mg/dL — AB (ref 8.9–10.3)
CO2: 31 mmol/L (ref 22–32)
Chloride: 100 mmol/L — ABNORMAL LOW (ref 101–111)
Creatinine, Ser: 1.15 mg/dL — ABNORMAL HIGH (ref 0.44–1.00)
GFR calc Af Amer: 60 mL/min — ABNORMAL LOW (ref >60)
GFR calc non Af Amer: 51 mL/min — ABNORMAL LOW (ref >60)
GLUCOSE: 93 mg/dL (ref 65–99)
POTASSIUM: 3.9 mmol/L (ref 3.5–5.1)
SODIUM: 141 mmol/L (ref 135–145)

## 2015-11-14 MED ORDER — LEVOFLOXACIN 750 MG PO TABS: 750.0000 mg | ORAL_TABLET | Freq: Every day | ORAL | Status: DC
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
## 2015-11-14 NOTE — Telephone Encounter (Signed)
Noted  

## 2015-11-14 NOTE — Progress Notes (Signed)
SATURATION QUALIFICATIONS: (This note is used to comply with regulatory documentation for home oxygen)  Patient Saturations on Room Air at Rest = 96%  Patient Saturations on Room Air while Ambulating = 90-96%  Patient Saturations on N/A Liters of oxygen while Ambulating = N/A%  Please briefly explain why patient needs home oxygen:  Pt DOES NOT qualify for home oxygen.

## 2015-11-14 NOTE — Telephone Encounter (Signed)
The last patient email we have is from 12/27. Please get details.

## 2015-11-14 NOTE — Telephone Encounter (Signed)
Call to patient and she said she has been in the hospital in [place] and she will come see Chelle at the walkin clinic on Friday.  She will bring her paperwork with her.  When I asked her about the mychart messages she said that she thought the phone was breaking up and they must have misunderstood what she said.

## 2015-11-14 NOTE — Progress Notes (Signed)
Pt discharged home today per Dr. Chiu. Pt's IV site D/C'd and WDL. Pt's VSS. Pt provided with home medication list, discharge instructions and prescriptions. Verbalized understanding. Pt left floor via WC in stable condition accompanied by NT. 

## 2015-11-18 ENCOUNTER — Ambulatory Visit (INDEPENDENT_AMBULATORY_CARE_PROVIDER_SITE_OTHER): Payer: Medicare Other | Admitting: Physician Assistant

## 2015-11-18 VITALS — BP 140/80 | HR 84 | Temp 99.0°F | Resp 16 | Ht 62.0 in | Wt 213.0 lb

## 2015-11-18 DIAGNOSIS — J189 Pneumonia, unspecified organism: Secondary | ICD-10-CM

## 2015-11-18 DIAGNOSIS — F319 Bipolar disorder, unspecified: Secondary | ICD-10-CM | POA: Diagnosis not present

## 2015-11-18 DIAGNOSIS — Z23 Encounter for immunization: Secondary | ICD-10-CM

## 2015-11-18 NOTE — Patient Instructions (Signed)
Finish the antibiotics and prednisone. Use the inhaler when you need it.

## 2015-11-18 NOTE — Progress Notes (Signed)
Patient ID: Catherine Farrell, female    DOB: 1956/02/01, 60 y.o.   MRN: [id number]  PCP: Demika Langenderfer, PA-C  Subjective:   Chief Complaint  Patient presents with  . Follow-up    pnumonia    HPI Presents for hospital follow-up.  After I saw her in 12/16, she did have the cholecystectomy on 11/04/15. The procedure went well, and a hernia was repaired at the same time. After returning home, she developed worsening abdominal pain, nausea and vomiting, sough and SOB. She presented to the ED on 11/08/2015 and was found to have a dense LEFT pneumonia.  She was admitted for CAP ad acute respiratory failure with hypoxia. She was discharged to home on 11/14/2015 in improved condition, to complete a course of Levaquin and prednisone taper.  She reports feeling good. Getting better and better each day. A little tender in the abdomen, but that's improving, too. Wants me to check the surgical sites.  "I'm thinking of coming of the Seroquel. I don't like the side effects. It messes with my mind. I feel like I'm just kind of out there. What do you think?" She notes that Seroquel has done a really good job of stabilizing her mood. She pleased with it's effectiveness, but wonders what her other options are.   Review of Systems  Constitutional: Positive for appetite change (decreased, which she is very pleased with. Can stop eating when she feels full.). Negative for fever and chills.  HENT: Negative for congestion, postnasal drip, rhinorrhea, sinus pressure and sore throat.   Eyes: Negative for photophobia and visual disturbance.  Respiratory: Negative for cough, chest tightness, shortness of breath and wheezing.   Cardiovascular: Negative for chest pain, palpitations and leg swelling.  Gastrointestinal: Positive for abdominal pain (mild tenderness at surgical sites). Negative for nausea, vomiting, diarrhea and constipation.  Neurological: Negative for dizziness, weakness and headaches.    Psychiatric/Behavioral: Negative for suicidal ideas, hallucinations, behavioral problems, confusion, sleep disturbance, self-injury, dysphoric mood, decreased concentration and agitation. The patient is not nervous/anxious and is not hyperactive.        Patient Active Problem List   Diagnosis Date Noted  . CAP (community acquired pneumonia) 11/08/2015  . Acute respiratory failure with hypoxia (HCC) 11/08/2015  . Abnormal thyroid function test 11/02/2015  . Hyperlipidemia 11/02/2015  . History of tobacco use 11/02/2015  . Myalgia and myositis 11/02/2015  . Cholelithiasis 10/03/2015  . Bipolar 1 disorder (HCC) 09/22/2015  . HTN (hypertension) 09/22/2015  . RLS (restless legs syndrome) 09/22/2015  . Chronic hepatitis C (HCC) 09/22/2015  . 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 Authorizing Provider  albuterol (VENTOLIN HFA) 108 (90 Base) MCG/ACT inhaler Inhale 1-2 puffs into the lungs every 4 (four) hours as needed for wheezing or shortness of breath. 11/13/15  Yes Jerald Kief, MD  amLODipine (NORVASC) 10 MG tablet Take 10 mg by mouth 2 (two) times daily.    Yes Historical Provider, MD  aspirin 81 MG tablet Take 81 mg by mouth daily.   Yes Historical Provider, MD  benzonatate (TESSALON) 100 MG capsule Take 1 capsule (100 mg total) by mouth 3 (three) times daily as needed for cough. 11/13/15  Yes Jerald Kief, MD  cyclobenzaprine (FLEXERIL) 10 MG tablet Take 1 tablet (10 mg total) by mouth 3 (three) times daily as needed for muscle spasms. 09/22/15  Yes Roshaunda Starkey, PA-C  diazepam (VALIUM) 10 MG tablet TAKE  ONE TABLET BY MOUTH EVERY SIX HOURS AS NEEDED FOR ANXIETY 10/24/15  Yes Lanier Clam V, PA-C  levofloxacin (LEVAQUIN) 750 MG tablet Take 1 tablet (750 mg total) by mouth daily. 11/14/15  Yes Jerald Kief, MD  lisinopril-hydrochlorothiazide (PRINZIDE,ZESTORETIC) 20-12.5 MG tablet Take 1 tablet by mouth  daily.  06/21/15  Yes Historical Provider, MD  metoprolol (LOPRESSOR) 100 MG tablet Take 1 tablet (100 mg total) by mouth 2 (two) times daily. 09/22/15  Yes Mackinley Kiehn, PA-C  Omega-3 Fatty Acids (OMEGA-3 FISH OIL PO) Take 1 capsule by mouth daily.    Yes Historical Provider, MD  pantoprazole (PROTONIX) 40 MG tablet Take 1 tablet (40 mg total) by mouth daily. 11/13/15  Yes Jerald Kief, MD  predniSONE (DELTASONE) 5 MG tablet Take 1 tablet (5 mg total) by mouth daily with breakfast. 11/13/15  Yes Jerald Kief, MD  Probiotic Product (PROBIOTIC DAILY PO) Take 1 capsule by mouth daily.    Yes Historical Provider, MD  QUEtiapine (SEROQUEL) 200 MG tablet Take 200 mg by mouth at bedtime.   Yes Historical Provider, MD  spironolactone (ALDACTONE) 25 MG tablet Take 25 mg by mouth daily.   Yes Historical Provider, MD     Allergies  Allergen Reactions  . Codeine Nausea Only  . Tramadol Hives and Rash       Objective:  Physical Exam  Constitutional: She is oriented to person, place, and time. She appears well-developed and well-nourished. She is active and cooperative. No distress.  BP 140/80 mmHg  Pulse 84  Temp(Src) 99 F (37.2 C) (Oral)  Resp 16  Ht  (1.575 m)  Wt 213 lb (96.616 kg)  BMI 38.95 kg/m2  SpO2 97%  HENT:  Head: Normocephalic and atraumatic.  Right Ear: Hearing normal.  Left Ear: Hearing normal.  Eyes: Conjunctivae are normal. No scleral icterus.  Neck: Normal range of motion. Neck supple. No thyromegaly present.  Cardiovascular: Normal rate, regular rhythm and normal heart sounds.   Pulses:      Radial pulses are 2+ on the right side, and 2+ on the left side.  Pulmonary/Chest: Effort normal and breath sounds normal.  Abdominal: Normal appearance and bowel sounds are normal. She exhibits no distension and no mass. There is no tenderness.  Healing surgical incision at the umbilicus. Other port sites are so well-healed that they are not evident!  Lymphadenopathy:        Head (right side): No tonsillar, no preauricular, no posterior auricular and no occipital adenopathy present.       Head (left side): No tonsillar, no preauricular, no posterior auricular and no occipital adenopathy present.    She has no cervical adenopathy.       Right: No supraclavicular adenopathy present.       Left: No supraclavicular adenopathy present.  Neurological: She is alert and oriented to person, place, and time. No sensory deficit.  Skin: Skin is warm, dry and intact. No rash noted. No cyanosis or erythema. Nails show no clubbing.  Psychiatric: She has a normal mood and affect. Her speech is normal and behavior is normal. Judgment and thought content normal. Cognition and memory are normal.           Assessment & Plan:   1. CAP (community acquired pneumonia) Improving. Complete Levaquin and oral prednisone taper.  2. Need for Tdap vaccination - Tdap vaccine greater than or equal to 7yo IM  3. Bipolar 1 disorder (HCC) Stable. If she'd like to consider alternate  treatment, I'll refer to psychiatry. She's tolerating Seroquel well enough and isn't interested right now.  Return in about 8 weeks (around 01/13/2016).    Fernande Bras, PA-C Physician Assistant-Certified [facility]

## 2015-11-21 ENCOUNTER — Other Ambulatory Visit: Payer: Self-pay | Admitting: Physician Assistant

## 2015-11-21 NOTE — Telephone Encounter (Signed)
Meds ordered this encounter  Medications  . diazepam (VALIUM) 10 MG tablet    Sig: TAKE ONE TABLET BY MOUTH EVERY SIX HOURS AS NEEDED FOR ANXIETY    Dispense:  60 tablet    Refill:  0    Not to exceed 6 additional fills before 04/21/2016.

## 2015-11-22 NOTE — Telephone Encounter (Signed)
Called in.

## 2015-11-25 ENCOUNTER — Ambulatory Visit: Payer: Medicare Other

## 2015-12-09 ENCOUNTER — Ambulatory Visit: Payer: Medicare Other

## 2015-12-18 ENCOUNTER — Other Ambulatory Visit: Payer: Self-pay | Admitting: Physician Assistant

## 2015-12-18 NOTE — Telephone Encounter (Signed)
Meds ordered this encounter  Medications  . diazepam (VALIUM) 10 MG tablet    Sig: TAKE ONE TABLET BY MOUTH EVERY SIX HOURS AS NEEDED FOR ANXIETY    Dispense:  60 tablet    Refill:  0    Not to exceed 5 additional fills before 05/20/2016.

## 2015-12-19 ENCOUNTER — Other Ambulatory Visit: Payer: Self-pay | Admitting: Physician Assistant

## 2015-12-19 NOTE — Telephone Encounter (Signed)
Advised pt it is ready and placed in pick up drawer.... KJR

## 2016-01-10 ENCOUNTER — Other Ambulatory Visit: Payer: Self-pay | Admitting: Physician Assistant

## 2016-01-18 ENCOUNTER — Ambulatory Visit (INDEPENDENT_AMBULATORY_CARE_PROVIDER_SITE_OTHER): Payer: Medicare Other | Admitting: Family Medicine

## 2016-01-18 ENCOUNTER — Ambulatory Visit (INDEPENDENT_AMBULATORY_CARE_PROVIDER_SITE_OTHER): Payer: Medicare Other

## 2016-01-18 VITALS — BP 162/98 | HR 60 | Temp 98.1°F | Resp 16 | Ht 62.0 in | Wt 218.4 lb

## 2016-01-18 DIAGNOSIS — I10 Essential (primary) hypertension: Secondary | ICD-10-CM

## 2016-01-18 DIAGNOSIS — R918 Other nonspecific abnormal finding of lung field: Secondary | ICD-10-CM | POA: Diagnosis not present

## 2016-01-18 DIAGNOSIS — M25512 Pain in left shoulder: Secondary | ICD-10-CM | POA: Diagnosis not present

## 2016-01-18 DIAGNOSIS — G709 Myoneural disorder, unspecified: Secondary | ICD-10-CM

## 2016-01-18 DIAGNOSIS — R9389 Abnormal findings on diagnostic imaging of other specified body structures: Secondary | ICD-10-CM

## 2016-01-18 DIAGNOSIS — R938 Abnormal findings on diagnostic imaging of other specified body structures: Secondary | ICD-10-CM

## 2016-01-18 MED ORDER — CYCLOBENZAPRINE HCL 10 MG PO TABS: 10.0000 mg | ORAL_TABLET | Freq: Three times a day (TID) | ORAL | Status: AC | PRN

## 2016-01-18 MED ORDER — AMLODIPINE BESYLATE 10 MG PO TABS: 10.0000 mg | ORAL_TABLET | Freq: Two times a day (BID) | ORAL | Status: DC

## 2016-01-18 MED ORDER — DIAZEPAM 10 MG PO TABS: 10.0000 mg | ORAL_TABLET | Freq: Two times a day (BID) | ORAL | Status: DC | PRN

## 2016-01-18 MED ORDER — MELOXICAM 7.5 MG PO TABS: 7.5000 mg | ORAL_TABLET | Freq: Every day | ORAL | Status: AC

## 2016-01-18 NOTE — Progress Notes (Signed)
Subjective:    Patient ID: Catherine Farrell, female    DOB: 1955-12-14, 60 y.o.   MRN: [id number]  HPI This is a pleasant 60 yo female who presents today with left arm pain for over a month. She had a tetanus shot 11/18/15 and believes the pain is related to this. She has tried Alleve, ibuprofen, BC and a medicine that she had left over from a hospitalization. She is unable to move her arm and it hurts all the time. Not sure if it has been red. She hears popping. Pain goes into shoulder and into neck and she has been hearing popping. No numbness or tingling down arm, feels weak.   Past Medical History  Diagnosis Date  . Blood transfusion without reported diagnosis   . Depression   . Hypertension   . Bipolar 1 disorder (HCC)   . Restless leg   . Hepatitis C   . Neuromuscular disorder (HCC)   . PNA (pneumonia) 08/2009    viral  . Shingles 06/2013    was outside acute window for treatment, started on gabapentin  . Cholelithiasis 10/03/2015    GB US 01/11/2014 was NEGATIVE for gallstones, wall thickening or pericholecystic fluid.    Past Surgical History  Procedure Laterality Date  . Colon surgery    . Tubal ligation    . Bladder surgery    . Abdominal hysterectomy      partial hysterectomy due to fibroids  . Cholecystectomy    . Hernia repair     Family History  Problem Relation Age of Onset  . Hyperlipidemia Mother   . Hypertension Mother   . Mental illness Mother     bipolar/manic  . Hyperlipidemia Father   . Mental illness Father     bipolar  . Alcohol abuse Father   . Cancer Father     lung cancer  . Hyperlipidemia Sister   . Mental illness Sister     bipolar  . Diabetes Sister   . Kidney disease Sister   . Hyperlipidemia Brother   . Mental illness Brother     bipolar/manic  . Cancer Brother     lung cancer  . Mental illness Brother     bipolar/manic  . Hyperlipidemia Brother   . Mental illness Daughter     bipolar  . Hypertension Daughter   . Scoliosis  Daughter   . Diabetes Daughter    Social History  Substance Use Topics  . Smoking status: Never Smoker   . Smokeless tobacco: None  . Alcohol Use: No      Review of Systems No cough, no SOB, + left shoulder/arm/neck pain    Objective:   Physical Exam  Constitutional: She is oriented to person, place, and time. She appears well-developed and well-nourished. No distress.  obese  HENT:  Head: Normocephalic and atraumatic.  Eyes: Conjunctivae are normal.  Neck: Normal range of motion. Neck supple.  Cardiovascular: Normal rate.   Pulmonary/Chest: Effort normal.  Musculoskeletal:       Left shoulder: She exhibits decreased range of motion, tenderness, bony tenderness, pain and spasm. She exhibits no deformity.  Left shoulder with generalized tenderness, decreased flexion/extension/internal/external rotation  Neurological: She is alert and oriented to person, place, and time.  Skin: Skin is warm and dry. She is not diaphoretic.  Psychiatric: She has a normal mood and affect. Her behavior is normal. Judgment and thought content normal.  Vitals reviewed.  BP 178/77 mmHg  Pulse 60  Temp(Src) 98.1 F (  36.7 C) (Oral)  Resp 16  Ht 5\' 2"  (1.575 m)  Wt 218 lb 6.4 oz (99.066 kg)  BMI 39.94 kg/m2  SpO2 98% Wt Readings from Last 3 Encounters:  01/18/16 218 lb 6.4 oz (99.066 kg)  11/18/15 213 lb (96.616 kg)  11/08/15 225 lb 6.4 oz (102.241 kg)  Dg Shoulder Left  01/18/2016  CLINICAL DATA:  Left shoulder pain with decreased range of motion and tenderness for 6 weeks. EXAM: LEFT SHOULDER - 2+ VIEW COMPARISON:  None. FINDINGS: There is no evidence of fracture or dislocation. There is no evidence of arthropathy or other focal bone abnormality. Soft tissues are unremarkable. Unexpected opacity at the left mid chest which could be from cardiomegaly or resent residual lung opacification seen on 11/14/2015 study. IMPRESSION: 1. Negative left shoulder. 2. Possible persistent left lung  opacification. Recommend two-view chest x-ray to correlate with 11/14/2015 study. Electronically Signed   By: Marnee SpringJonathon  Watts M.D.   On: 01/18/2016 09:16       Assessment & Plan:  1. Left shoulder pain - DG Shoulder Left; Future - meloxicam (MOBIC) 7.5 MG tablet; Take 1 tablet (7.5 mg total) by mouth daily.  Dispense: 30 tablet; Refill: 0 - Ambulatory referral to Orthopedic Surgery  2. Neuromuscular disorder (HCC) - cyclobenzaprine (FLEXERIL) 10 MG tablet; Take 1 tablet (10 mg total) by mouth 3 (three) times daily as needed for muscle spasms.  Dispense: 30 tablet; Refill: 3  3. Essential hypertension - amLODipine (NORVASC) 10 MG tablet; Take 1 tablet (10 mg total) by mouth 2 (two) times daily.  Dispense: 60 tablet; Refill: 2 - diazepam (VALIUM) 10 MG tablet; Take 1 tablet (10 mg total) by mouth every 12 (twelve) hours as needed for anxiety.  Dispense: 60 tablet; Refill: 1  4. Abnormal x-ray - The patient left prior to me realizing she needed repeat chest xray, I have entered the order and asked her to come in for an xray only visit - DG Chest 2 View; Future  5. Opacity of lung on imaging study - DG Chest 2 View; Future   Olean Reeeborah Jalal Rauch, FNP-BC  [facility]  01/20/2016 1:07 PM

## 2016-01-18 NOTE — Patient Instructions (Addendum)
Continue heat and flexeril I have sent in a prescription for a once a day anti- inflammatory called meloxicam Take it once every 24 hours. While taking meloxicam, don't take additional anti- inflammatory medicines like aspirin, ibuprofen or alleve   Shoulder Pain The shoulder is the joint that connects your arms to your body. The bones that form the shoulder joint include the upper arm bone (humerus), the shoulder blade (scapula), and the collarbone (clavicle). The top of the humerus is shaped like a ball and fits into a rather flat socket on the scapula (glenoid cavity). A combination of muscles and strong, fibrous tissues that connect muscles to bones (tendons) support your shoulder joint and hold the ball in the socket. Small, fluid-filled sacs (bursae) are located in different areas of the joint. They act as cushions between the bones and the overlying soft tissues and help reduce friction between the gliding tendons and the bone as you move your arm. Your shoulder joint allows a wide range of motion in your arm. This range of motion allows you to do things like scratch your back or throw a ball. However, this range of motion also makes your shoulder more prone to pain from overuse and injury. Causes of shoulder pain can originate from both injury and overuse and usually can be grouped in the following four categories:  Redness, swelling, and pain (inflammation) of the tendon (tendinitis) or the bursae (bursitis).  Instability, such as a dislocation of the joint.  Inflammation of the joint (arthritis).  Broken bone (fracture). HOME CARE INSTRUCTIONS   Apply ice to the sore area.  Put ice in a plastic bag.  Place a towel between your skin and the bag.  Leave the ice on for 15-20 minutes, 3-4 times per day for the first 2 days, or as directed by your health care provider.  Stop using cold packs if they do not help with the pain.  If you have a shoulder sling or immobilizer, wear it as  long as your caregiver instructs. Only remove it to shower or bathe. Move your arm as little as possible, but keep your hand moving to prevent swelling.  Squeeze a soft ball or foam pad as much as possible to help prevent swelling.  Only take over-the-counter or prescription medicines for pain, discomfort, or fever as directed by your caregiver. SEEK MEDICAL CARE IF:   Your shoulder pain increases, or new pain develops in your arm, hand, or fingers.  Your hand or fingers become cold and numb.  Your pain is not relieved with medicines. SEEK IMMEDIATE MEDICAL CARE IF:   Your arm, hand, or fingers are numb or tingling.  Your arm, hand, or fingers are significantly swollen or turn white or blue. MAKE SURE YOU:   Understand these instructions.  Will watch your condition.  Will get help right away if you are not doing well or get worse.   This information is not intended to replace advice given to you by your health care provider. Make sure you discuss any questions you have with your health care provider.   Document Released: 07/04/2005 Document Revised: 10/15/2014 Document Reviewed: 01/17/2015 Elsevier Interactive Patient Education [employer]2016 Elsevier Inc.     IF you received an x-ray today, you will receive an invoice from [facility]. Please contact [facility] at [phone] with questions or concerns regarding your invoice.   IF you received labwork today, you will receive an invoice from [facility]. Please contact [facility] at [phone] with  questions or concerns regarding your invoice.   Our billing staff will not be able to assist you with questions regarding bills from these companies.  You will be contacted with the lab results as soon as they are available. The fastest way to get your results is to activate your My Chart account. Instructions are located on the last page of this paperwork. If you have not heard from [place] regarding  the results in 2 weeks, please contact this office.

## 2016-01-20 ENCOUNTER — Ambulatory Visit: Payer: Medicare Other

## 2016-01-20 ENCOUNTER — Encounter: Payer: Self-pay | Admitting: Family Medicine

## 2016-01-21 ENCOUNTER — Ambulatory Visit: Payer: Medicare Other

## 2016-01-21 NOTE — Addendum Note (Signed)
Addended by: Felix AhmadiFRANSEN, Josemiguel Gries A on: 01/21/2016 08:35 AM   Modules accepted: Kipp BroodSmartSet

## 2016-01-24 ENCOUNTER — Telehealth: Payer: Self-pay

## 2016-01-24 NOTE — Telephone Encounter (Signed)
Spoke with pt, she states the meloxicam is not working at all for her. Can we also check on her referral to neurosurgeon.

## 2016-01-24 NOTE — Telephone Encounter (Signed)
Pt is needing to talk with someone about changing her pain medication  Best number [phone]

## 2016-01-24 NOTE — Telephone Encounter (Signed)
Please check on referral to orthopedic surgery. I also sent her a message that she needs a repeat chest xray to follow up her pneumonia. She can take two of the meloxicam and see if that helps. Also, heat. If not improved with that, come back in.

## 2016-01-25 NOTE — Telephone Encounter (Signed)
Pt advised.

## 2016-01-27 ENCOUNTER — Telehealth: Payer: Self-pay

## 2016-01-27 NOTE — Telephone Encounter (Signed)
Needs xrays of chest to pick it up on Monday  Best number [phone]  And need to talk with someone about blood pressure issues and increasing her meds she is not going to do this and is wanting a call back today

## 2016-01-30 ENCOUNTER — Other Ambulatory Visit: Payer: Self-pay | Admitting: Family Medicine

## 2016-01-30 DIAGNOSIS — I10 Essential (primary) hypertension: Secondary | ICD-10-CM

## 2016-01-30 NOTE — Telephone Encounter (Signed)
Pt is wanting copy of shoulder films. Will have ready for pick up on Wednesday.  Pt says her BP is fine at home. She does not want to up her Amlodipine dosage to 60mg . Pt wants to stay with 30mg  tablets. Pt would like 30mg  tablets sent in. Pt says she regularly checks her BP at home.

## 2016-01-30 NOTE — Telephone Encounter (Signed)
I sent a refill to her pharmacy when she was in the office on 01/18/16. I sent her a message that she needs a repeat CXR. I have put the order in, she does not need to be seen. It is to follow up her pneumonia from earlier this year. Please keep a log of blood pressures and bring it to next visit.

## 2016-02-01 ENCOUNTER — Ambulatory Visit (INDEPENDENT_AMBULATORY_CARE_PROVIDER_SITE_OTHER): Payer: Medicare Other | Admitting: Physician Assistant

## 2016-02-01 ENCOUNTER — Ambulatory Visit (INDEPENDENT_AMBULATORY_CARE_PROVIDER_SITE_OTHER): Payer: Medicare Other

## 2016-02-01 DIAGNOSIS — J189 Pneumonia, unspecified organism: Secondary | ICD-10-CM | POA: Diagnosis not present

## 2016-02-01 NOTE — Progress Notes (Signed)
Chest xray ONLY, No OV. Will route to Deboraha Sprangebbie Gessner, FNP, who placed the future order for CXR.

## 2016-02-03 ENCOUNTER — Telehealth: Payer: Self-pay

## 2016-02-03 NOTE — Telephone Encounter (Signed)
Patient is calling for xray results. Please call! [phone]

## 2016-02-06 NOTE — Telephone Encounter (Signed)
Spoke with pt and let her know the pneumonia is resolving.

## 2016-02-06 NOTE — Telephone Encounter (Signed)
Please review

## 2016-02-06 NOTE — Telephone Encounter (Signed)
The pneumonia is resolving.

## 2016-02-10 DIAGNOSIS — M7502 Adhesive capsulitis of left shoulder: Secondary | ICD-10-CM | POA: Diagnosis not present

## 2016-02-15 ENCOUNTER — Other Ambulatory Visit: Payer: Self-pay | Admitting: Physician Assistant

## 2016-02-27 ENCOUNTER — Ambulatory Visit: Payer: Medicare Other | Attending: Orthopedic Surgery | Admitting: Physical Therapy

## 2016-02-27 DIAGNOSIS — M25612 Stiffness of left shoulder, not elsewhere classified: Secondary | ICD-10-CM | POA: Insufficient documentation

## 2016-02-27 DIAGNOSIS — M25512 Pain in left shoulder: Secondary | ICD-10-CM | POA: Diagnosis not present

## 2016-02-27 NOTE — Therapy (Signed)
[facility] [address] [place], [place], [address] Phone: [phone]   Fax:  [phone]  Physical Therapy Evaluation  Patient Details  Name: Catherine Farrell MRN: [id number] Date of Birth: Feb 11, 1956 Referring Provider: Francena Hanly MD.  Encounter Date: 02/27/2016    Past Medical History  Diagnosis Date  . Blood transfusion without reported diagnosis   . Depression   . Hypertension   . Bipolar 1 disorder (HCC)   . Restless leg   . Hepatitis C   . Neuromuscular disorder (HCC)   . PNA (pneumonia) 08/2009    viral  . Shingles 06/2013    was outside acute window for treatment, started on gabapentin  . Cholelithiasis 10/03/2015    GB US 01/11/2014 was NEGATIVE for gallstones, wall thickening or pericholecystic fluid.     Past Surgical History  Procedure Laterality Date  . Colon surgery    . Tubal ligation    . Bladder surgery    . Abdominal hysterectomy      partial hysterectomy due to fibroids  . Cholecystectomy    . Hernia repair      There were no vitals filed for this visit.       Subjective Assessment - 02/27/16 1035    Subjective Got a tetanus shot on 11/18/15 and my left shoulder hurt a lot and started to freeze.   Patient Stated Goals Use my left shoulder agin.   Currently in Pain? Yes   Pain Score 8    Pain Location Shoulder   Pain Orientation Left   Pain Descriptors / Indicators Aching;Throbbing   Pain Type Acute pain   Pain Onset More than a month ago   Pain Frequency Constant   Aggravating Factors  Movement of left UE.   Pain Relieving Factors Rest.            [facility] PT Assessment - 02/27/16 0001    Assessment   Medical Diagnosis Adhesive capsulitis ofleft shoulder.   Referring Provider Francena Hanly MD.   Onset Date/Surgical Date --  11/18/15.   Hand Dominance Right   Next MD Visit --  03/09/16.   Precautions   Precautions None   Restrictions   Weight Bearing Restrictions No   Balance Screen    Has the patient fallen in the past 6 months No   Has the patient had a decrease in activity level because of a fear of falling?  Yes   Is the patient reluctant to leave their home because of a fear of falling?  No   Home [employer]ist information centre manager residence   Prior Function   Level of Independence Independent   Posture/Postural Control   Posture Comments Left arm held protectively by side.   ROM / Strength   AROM / PROM / Strength PROM;Strength   PROM   Overall PROM Comments In supine the patient's left shoulder flexion= 44 degrees; IR= 30 degrees and ER= 50 degrees.   Strength   Overall Strength Comments Left shoulder IR/ER not graded above a 3+/5 which strength appears decreased due to pain.  Flexion= 2+ to 3-/5.   Palpation   Palpation comment Palapble pain over left UT, and left shoulder acromial ridge with referral of pain into left middle deltoid region.  The patient also reports pain will go as far as her left wrist.   Ambulation/Gait   Gait Comments WNL.  [facility] - 02/27/16 0001    Modalities   Modalities Electrical Stimulation   Electrical Stimulation   Electrical Stimulation Location Left shoulder.   Electrical Stimulation Action IFC   Electrical Stimulation Parameters 80-150 Hz x 15 minutes.                  PT Short Term Goals - 02/27/16 1118    PT SHORT TERM GOAL #1   Title Ind with a HEP.   Time 2   Period Weeks   Status New           PT Long Term Goals - 02/27/16 1118    PT LONG TERM GOAL #1   Title Active left shoulder flexion to 155 degrees so the patient can easily reach overhead   Time 4   Period Weeks   Status New   PT LONG TERM GOAL #2   Title Active ER to 80 degrees+ to allow for easily donning/doffing of apparel   Time 4   Period Weeks   Status New   PT LONG TERM GOAL #3   Title Increase ROM so patient is able to reach behind back to L3.   Time 4   Period  Weeks   Status New   PT LONG TERM GOAL #4   Title Increase left shoulder strength to a solid 4+/5 to increase stability for performance of functional activities   Time 4   Period Weeks   Status New   PT LONG TERM GOAL #5   Title Perform ADL's with left shoulder pain not > 3/10.               Plan - 02/27/16 1107    Clinical Impression Statement The patient received a tetanus shot in her left shoulder on 11/18/15.  The pain has worsened and she has lost range of motion.  The patient reports a pain-level of 8/10.  She is now c/o pain that is moving into her neck and referred pain as low as to her wrist at times.  She recently received a cortisone injection which was helpful.   Rehab Potential Good   PT Frequency 3x / week   PT Duration 4 weeks   PT Treatment/Interventions ADLs/Self Care Home Management;Cryotherapy;Electrical Stimulation;Moist Heat;Therapeutic exercise;Therapeutic activities;Ultrasound;Patient/family education;Manual techniques;Passive range of motion   PT Next Visit Plan Gentle left shoulder PROM; modalites and STW/M.Marland Kitchen.Marland Kitchen.progress to    Consulted and Agree with Plan of Care Patient      Patient will benefit from skilled therapeutic intervention in order to improve the following deficits and impairments:  Pain, Decreased activity tolerance, Decreased range of motion, Decreased strength  Visit Diagnosis: Pain in left shoulder - Plan: PT plan of care cert/re-cert  Stiffness of left shoulder, not elsewhere classified - Plan: PT plan of care cert/re-cert      G-Codes - 02/27/16 1111    Functional Assessment Tool Used FOTO...73% limitation.   Functional Limitation Other PT primary   Other PT Primary Current Status (Z6109(G8990) At least 60 percent but less than 80 percent impaired, limited or restricted   Other PT Primary Goal Status (U0454(G8991) At least 20 percent but less than 40 percent impaired, limited or restricted       Problem List Patient Active Problem List    Diagnosis Date Noted  . CAP (community acquired pneumonia) 11/08/2015  . Acute respiratory failure with hypoxia (HCC) 11/08/2015  . Abnormal thyroid function test 11/02/2015  . Hyperlipidemia 11/02/2015  . History of  tobacco use 11/02/2015  . Myalgia and myositis 11/02/2015  . Bipolar 1 disorder (HCC) 09/22/2015  . HTN (hypertension) 09/22/2015  . RLS (restless legs syndrome) 09/22/2015  . Chronic hepatitis C (HCC) 09/22/2015  . sensory trigeminal neuropathy 09/22/2015    Kapri Nero, [place] MPT 02/27/2016, 11:22 AM  [facility] [address] [place], [place], [address] Phone: [phone]   Fax:  [phone]  Name: Joanmarie Tsang MRN: [id number] Date of Birth: [birth year]

## 2016-03-01 ENCOUNTER — Encounter: Payer: Medicare Other | Admitting: Physical Therapy

## 2016-03-04 ENCOUNTER — Other Ambulatory Visit: Payer: Self-pay | Admitting: Physician Assistant

## 2016-03-06 ENCOUNTER — Ambulatory Visit: Payer: Medicare Other | Admitting: Physical Therapy

## 2016-03-06 DIAGNOSIS — M25612 Stiffness of left shoulder, not elsewhere classified: Secondary | ICD-10-CM

## 2016-03-06 DIAGNOSIS — M25512 Pain in left shoulder: Secondary | ICD-10-CM

## 2016-03-06 NOTE — Patient Instructions (Signed)
  Flexibility: Upper Trapezius Stretch   Gently grasp right side of head while reaching behind back with other hand. Tilt head away until a gentle stretch is felt. Hold 30 seconds. Repeat 3 times per set. Do 2 sessions per day.  http://orth.exer.us/340   Levator Stretch   Grasp seat or sit on hand on side to be stretched. Turn head toward other side and look down. Use hand on head to gently stretch neck in that position. Hold _30___ seconds. Repeat on other side. Repeat 3 times. Do 2 sessions per day.  http://gt2.exer.us/30   Scapular Retraction (Standing)   With arms at sides, pinch shoulder blades together. Repeat 10 times per set. Do 1-3 sets per session. Do 2 sessions per day.   Posture - Sitting   Sit upright, head facing forward. Try using a roll to support lower back. Keep shoulders relaxed, and avoid rounded back. Keep hips level with knees. Avoid crossing legs for long periods.   Cane Exercise: Extension / Internal Rotation   Stand holding cane behind back with both hands palm-up. Slide cane up spine toward head. Hold ____ seconds. Repeat _10-20___ times. Do _1-2___ sessions per day.  http://gt2.exer.us/85   Copyright  VHI. All rights reserved.  [place].  Cane Exercise: Extension   Stand holding cane behind back with both hands palm-up. Lift the cane away from body. Hold ____ seconds. Repeat _10-20___ times. Do _1-2___ sessions per day.  http://gt2.exer.us/83   Copyright  VHI. All rights reserved.   Flexion (Passive)   Sitting upright, slide forearm forward along table, bending from the waist until a stretch is felt. Hold _5-10___ seconds. Repeat __10__ times. Do _2-3___ sessions per day.   Solon PalmJulie Deshia Vanderhoof, PT 03/06/2016 2:30 PM [facility] [address] [place], [place], [address] Phone: [phone]   Fax:  [phone]

## 2016-03-06 NOTE — Therapy (Signed)
[facility] [place], [place], [address] Phone: [phone]   Fax:  [phone]  Physical Therapy Treatment  Patient Details  Name: Catherine Farrell MRN: [id number] Date of Birth: 1956-04-16 Referring Provider: Justice Britain MD.  Encounter Date: 03/06/2016      PT End of Session - 03/06/16 1343    Visit Number 2   Date for PT Re-Evaluation 03/26/16   PT Start Time 1345   PT Stop Time 1439   PT Time Calculation (min) 54 min   Activity Tolerance Patient tolerated treatment well;Patient limited by pain   Behavior During Therapy [facility] for tasks assessed/performed      Past Medical History  Diagnosis Date  . Blood transfusion without reported diagnosis   . Depression   . Hypertension   . Bipolar 1 disorder ([place])   . Restless leg   . Hepatitis C   . Neuromuscular disorder ([place])   . PNA (pneumonia) 08/2009    viral  . Shingles 06/2013    was outside acute window for treatment, started on gabapentin  . Cholelithiasis 10/03/2015    GB US 01/11/2014 was NEGATIVE for gallstones, wall thickening or pericholecystic fluid.     Past Surgical History  Procedure Laterality Date  . Colon surgery    . Tubal ligation    . Bladder surgery    . Abdominal hysterectomy      partial hysterectomy due to fibroids  . Cholecystectomy    . Hernia repair      There were no vitals filed for this visit.      Subjective Assessment - 03/06/16 1344    Subjective Not good. It hurts.   Currently in Pain? Yes   Pain Score 8    Pain Location Shoulder   Pain Orientation Left   Pain Descriptors / Indicators Aching;Throbbing   Pain Type Acute pain   Pain Onset More than a month ago   Pain Frequency Constant   Aggravating Factors  movement   Pain Relieving Factors rest                         [facility] Adult PT Treatment/Exercise - 03/06/16 0001    Exercises   Exercises Shoulder   Shoulder Exercises: Seated   Retraction  Strengthening;Both;10 reps   Flexion AAROM;10 reps;Left   Abduction AAROM;Left;10 reps   Shoulder Exercises: Standing   Flexion AAROM;Left;10 reps   Shoulder Exercises: ROM/Strengthening   Wall Wash AAROM for flexion x 10   Other ROM/Strengthening Exercises Cane for extension and IR behind back x 10 each   Shoulder Exercises: Stretch   Other Shoulder Stretches cervical tension stretches UT, levator scap 1 x 30 sec each.   Modalities   Modalities Electrical Stimulation;Cryotherapy   Cryotherapy   Number Minutes Cryotherapy 15 Minutes   Cryotherapy Location Shoulder   Type of Cryotherapy Ice pack   Electrical Stimulation   Electrical Stimulation Location L shouder   Electrical Stimulation Action IFC x 15 min 80-150 Hz to tolerance   Electrical Stimulation Goals Pain   Manual Therapy   Manual Therapy Joint mobilization;Soft tissue mobilization;Passive ROM   Joint Mobilization L GH joint gd I/II mobs and oscillations for pain control   Soft tissue mobilization to L pectorals and deltoids   Passive ROM L shoulder flex, ER, IR                PT Education - 03/06/16 1552    Education provided  Yes   Education Details HeP   Person(s) Educated Patient   Methods Explanation;Demonstration;Handout   Comprehension Verbalized understanding;Returned demonstration          PT Short Term Goals - 02/27/16 1118    PT SHORT TERM GOAL #1   Title Ind with a HEP.   Time 2   Period Weeks   Status New           PT Long Term Goals - 02/27/16 1118    PT LONG TERM GOAL #1   Title Active left shoulder flexion to 155 degrees so the patient can easily reach overhead   Time 4   Period Weeks   Status New   PT LONG TERM GOAL #2   Title Active ER to 80 degrees+ to allow for easily donning/doffing of apparel   Time 4   Period Weeks   Status New   PT LONG TERM GOAL #3   Title Increase ROM so patient is able to reach behind back to L3.   Time 4   Period Weeks   Status New   PT  LONG TERM GOAL #4   Title Increase left shoulder strength to a solid 4+/5 to increase stability for performance of functional activities   Time 4   Period Weeks   Status New   PT LONG TERM GOAL #5   Title Perform ADL's with left shoulder pain not > 3/10.               Plan - 03/06/16 1552    Clinical Impression Statement Patient tolerated manual therapy fair. She is hypersensitive to touch and has significant muscle guarding, but she did relax somewhat as treatement progressed. She did very well with TE for AAROM. She has active and latent trigger points throughout the left upper quadrant which would benefit from Dry Needling. No goals met as only second visit.   Rehab Potential Good   PT Frequency 3x / week   PT Duration 4 weeks   PT Treatment/Interventions ADLs/Self Care Home Management;Cryotherapy;Electrical Stimulation;Moist Heat;Therapeutic exercise;Therapeutic activities;Ultrasound;Patient/family education;Manual techniques;Passive range of motion;Dry needling   PT Next Visit Plan Continue with TE, STW/MFR, and modalties for pain. Initiate DN if approved. Recert for DN sent 2/72/53.   PT Home Exercise Plan AAROM - flex, ext, IR; cervical stretches; scapular retraction.   Consulted and Agree with Plan of Care Patient      Patient will benefit from skilled therapeutic intervention in order to improve the following deficits and impairments:  Pain, Decreased activity tolerance, Decreased range of motion, Decreased strength  Visit Diagnosis: Stiffness of left shoulder, not elsewhere classified - Plan: PT plan of care cert/re-cert  Pain in left shoulder - Plan: PT plan of care cert/re-cert     Problem List Patient Active Problem List   Diagnosis Date Noted  . CAP (community acquired pneumonia) 11/08/2015  . Acute respiratory failure with hypoxia ([place]) 11/08/2015  . Abnormal thyroid function test 11/02/2015  . Hyperlipidemia 11/02/2015  . History of tobacco use 11/02/2015   . Myalgia and myositis 11/02/2015  . Bipolar 1 disorder ([place]) 09/22/2015  . HTN (hypertension) 09/22/2015  . RLS (restless legs syndrome) 09/22/2015  . Chronic hepatitis C ([place]) 09/22/2015  . sensory trigeminal neuropathy 09/22/2015    Madelyn Flavors PT  03/06/2016, 4:03 PM  [facility] [address] [place], [place], [address] Phone: [phone]   Fax:  [phone]  Name: Catherine Farrell MRN: [id number] Date of Birth: 1956-04-14

## 2016-03-07 ENCOUNTER — Telehealth: Payer: Self-pay

## 2016-03-07 DIAGNOSIS — N898 Other specified noninflammatory disorders of vagina: Secondary | ICD-10-CM | POA: Diagnosis not present

## 2016-03-07 DIAGNOSIS — R300 Dysuria: Secondary | ICD-10-CM | POA: Diagnosis not present

## 2016-03-07 DIAGNOSIS — Z711 Person with feared health complaint in whom no diagnosis is made: Secondary | ICD-10-CM | POA: Diagnosis not present

## 2016-03-07 NOTE — Telephone Encounter (Signed)
Waiting on payment of $40.25 from [employer]

## 2016-03-08 ENCOUNTER — Encounter: Payer: Medicare Other | Admitting: Physical Therapy

## 2016-03-12 NOTE — Telephone Encounter (Signed)
Payment received and records faxed on 03/12/16

## 2016-03-14 ENCOUNTER — Encounter: Payer: Self-pay | Admitting: Physical Therapy

## 2016-03-14 ENCOUNTER — Ambulatory Visit: Payer: Medicare Other | Attending: Orthopedic Surgery | Admitting: Physical Therapy

## 2016-03-14 DIAGNOSIS — M25512 Pain in left shoulder: Secondary | ICD-10-CM | POA: Diagnosis not present

## 2016-03-14 DIAGNOSIS — M25612 Stiffness of left shoulder, not elsewhere classified: Secondary | ICD-10-CM | POA: Insufficient documentation

## 2016-03-14 NOTE — Therapy (Signed)
[facility] [address] [place], [place], [address] Phone: [phone]   Fax:  [phone]  Physical Therapy Treatment  Patient Details  Name: Catherine Farrell MRN: [id number] Date of Birth: 07-21-1956 Referring Provider: Francena Hanly MD.  Encounter Date: 03/14/2016      PT End of Session - 03/14/16 1333    Visit Number 3   Date for PT Re-Evaluation 03/26/16   PT Start Time 1315   PT Stop Time 1401   PT Time Calculation (min) 46 min   Activity Tolerance Patient tolerated treatment well;Patient limited by pain   Behavior During Therapy [facility] for tasks assessed/performed      Past Medical History  Diagnosis Date  . Blood transfusion without reported diagnosis   . Depression   . Hypertension   . Bipolar 1 disorder (HCC)   . Restless leg   . Hepatitis C   . Neuromuscular disorder (HCC)   . PNA (pneumonia) 08/2009    viral  . Shingles 06/2013    was outside acute window for treatment, started on gabapentin  . Cholelithiasis 10/03/2015    GB US 01/11/2014 was NEGATIVE for gallstones, wall thickening or pericholecystic fluid.     Past Surgical History  Procedure Laterality Date  . Colon surgery    . Tubal ligation    . Bladder surgery    . Abdominal hysterectomy      partial hysterectomy due to fibroids  . Cholecystectomy    . Hernia repair      There were no vitals filed for this visit.      Subjective Assessment - 03/14/16 1322    Subjective Patient reported sore shoulder today   Patient Stated Goals Use my left shoulder agin.   Currently in Pain? Yes   Pain Score 7    Pain Location Shoulder   Pain Orientation Left   Pain Descriptors / Indicators Aching;Sore   Pain Type Acute pain   Pain Onset More than a month ago   Pain Frequency Constant   Aggravating Factors  movement of shoulder   Pain Relieving Factors rest            [facility] PT Assessment - 03/14/16 0001    PROM   Overall PROM  Deficits   Overall  PROM Comments Supine 110 Flexion, 65 ER                     [facility] Adult PT Treatment/Exercise - 03/14/16 0001    Cryotherapy   Number Minutes Cryotherapy 15 Minutes   Cryotherapy Location Shoulder   Type of Cryotherapy Ice pack   Electrical Stimulation   Electrical Stimulation Location L shouder   Electrical Stimulation Action IFC   Electrical Stimulation Parameters 80-150hz    Electrical Stimulation Goals Pain   Manual Therapy   Manual Therapy Passive ROM   Passive ROM L shoulder gentle PROM for  flex, ER, IR                  PT Short Term Goals - 02/27/16 1118    PT SHORT TERM GOAL #1   Title Ind with a HEP.   Time 2   Period Weeks   Status New           PT Long Term Goals - 02/27/16 1118    PT LONG TERM GOAL #1   Title Active left shoulder flexion to 155 degrees so the patient can easily reach overhead   Time 4  Period Weeks   Status New   PT LONG TERM GOAL #2   Title Active ER to 80 degrees+ to allow for easily donning/doffing of apparel   Time 4   Period Weeks   Status New   PT LONG TERM GOAL #3   Title Increase ROM so patient is able to reach behind back to L3.   Time 4   Period Weeks   Status New   PT LONG TERM GOAL #4   Title Increase left shoulder strength to a solid 4+/5 to increase stability for performance of functional activities   Time 4   Period Weeks   Status New   PT LONG TERM GOAL #5   Title Perform ADL's with left shoulder pain not > 3/10.               Plan - 03/14/16 1339    Clinical Impression Statement Patient progressing with improved PROM today and tolerated treatment with no pain complaints. Patient requested only PROM and no exercises today. Patient reported doing HEP daily yet only for a short time due to pain. Patient goals ongoing due to pain, strength and ROM deficits   Rehab Potential Good   PT Frequency 3x / week   PT Duration 4 weeks   PT Treatment/Interventions ADLs/Self Care Home  Management;Cryotherapy;Electrical Stimulation;Moist Heat;Therapeutic exercise;Therapeutic activities;Ultrasound;Patient/family education;Manual techniques;Passive range of motion;Dry needling   PT Next Visit Plan Continue with TE, STW/MFR, and modalties for pain. Initiate DN if approved. Recert for DN sent 03/06/16.   Consulted and Agree with Plan of Care Patient      Patient will benefit from skilled therapeutic intervention in order to improve the following deficits and impairments:  Pain, Decreased activity tolerance, Decreased range of motion, Decreased strength  Visit Diagnosis: Stiffness of left shoulder, not elsewhere classified  Pain in left shoulder     Problem List Patient Active Problem List   Diagnosis Date Noted  . CAP (community acquired pneumonia) 11/08/2015  . Acute respiratory failure with hypoxia (HCC) 11/08/2015  . Abnormal thyroid function test 11/02/2015  . Hyperlipidemia 11/02/2015  . History of tobacco use 11/02/2015  . Myalgia and myositis 11/02/2015  . Bipolar 1 disorder (HCC) 09/22/2015  . HTN (hypertension) 09/22/2015  . RLS (restless legs syndrome) 09/22/2015  . Chronic hepatitis C (HCC) 09/22/2015  . 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 [place], [place], [address] Phone: [phone]   Fax:  [phone]  Name: Catherine StallionCarolyn Farrell MRN: [id number] Date of Birth: 08/28/1956

## 2016-03-19 ENCOUNTER — Other Ambulatory Visit: Payer: Self-pay | Admitting: Family Medicine

## 2016-03-19 ENCOUNTER — Other Ambulatory Visit: Payer: Self-pay

## 2016-03-19 DIAGNOSIS — I10 Essential (primary) hypertension: Secondary | ICD-10-CM

## 2016-03-19 NOTE — Telephone Encounter (Signed)
Patient request a refill diazepam (VALIUM) 10 MG tablet [[id number]. Patient request that we fax over showing she does have one more ref[place] left on medication[facility] [place] [phone].

## 2016-03-20 ENCOUNTER — Encounter: Payer: Medicare Other | Admitting: Physical Therapy

## 2016-03-20 ENCOUNTER — Encounter: Payer: Self-pay | Admitting: Physician Assistant

## 2016-03-20 DIAGNOSIS — M7502 Adhesive capsulitis of left shoulder: Secondary | ICD-10-CM | POA: Insufficient documentation

## 2016-03-21 NOTE — Telephone Encounter (Signed)
Patient wants to know why her medication hasn't been refilled. I informed patient that it takes 24-72 hours. Patient states that she needs her medication now because she's about to have a panic attack. Please advise!

## 2016-03-21 NOTE — Telephone Encounter (Signed)
I called pt's pharm and they reported that pt has already gotten her 1 RF that was put on the 4/12 Rx (filled on 4/12 and 5/12). Pt is due for RFs. Debbie, since pt is having increased anxiety I will send this to you since you saw pt last for this. Chelle is out of office until Friday. Please forward to her if you need for her to review instead. Thank you!

## 2016-03-22 ENCOUNTER — Ambulatory Visit: Payer: Medicare Other | Admitting: Physical Therapy

## 2016-03-22 ENCOUNTER — Encounter: Payer: Self-pay | Admitting: Physical Therapy

## 2016-03-22 ENCOUNTER — Other Ambulatory Visit: Payer: Self-pay | Admitting: Family Medicine

## 2016-03-22 DIAGNOSIS — M25612 Stiffness of left shoulder, not elsewhere classified: Secondary | ICD-10-CM

## 2016-03-22 DIAGNOSIS — M25512 Pain in left shoulder: Secondary | ICD-10-CM | POA: Diagnosis not present

## 2016-03-22 MED ORDER — DIAZEPAM 10 MG PO TABS: 10.0000 mg | ORAL_TABLET | Freq: Two times a day (BID) | ORAL | Status: DC | PRN

## 2016-03-22 NOTE — Telephone Encounter (Signed)
Patient called to follow up on diazepam prescription and I am unable to find it. [facility] [place] [facility]

## 2016-03-22 NOTE — Telephone Encounter (Signed)
Faxed

## 2016-03-22 NOTE — Therapy (Signed)
[facility] [place], [place], [address] Phone: [phone]   Fax:  [phone]  Physical Therapy Treatment  Patient Details  Name: Catherine Farrell MRN: [id number] Date of Birth: Jun 04, 1956 Referring Provider: Justice Britain MD.  Encounter Date: 03/22/2016      PT End of Session - 03/22/16 1349    Visit Number 4   Date for PT Re-Evaluation 03/26/16   PT Start Time 5631   PT Stop Time 1432   PT Time Calculation (min) 43 min   Activity Tolerance Patient tolerated treatment well   Behavior During Therapy [facility] for tasks assessed/performed      Past Medical History  Diagnosis Date  . Blood transfusion without reported diagnosis   . Depression   . Hypertension   . Bipolar 1 disorder ([place])   . Restless leg   . Hepatitis C   . Neuromuscular disorder ([place])   . PNA (pneumonia) 08/2009    viral  . Shingles 06/2013    was outside acute window for treatment, started on gabapentin  . Cholelithiasis 10/03/2015    GB US 01/11/2014 was NEGATIVE for gallstones, wall thickening or pericholecystic fluid.     Past Surgical History  Procedure Laterality Date  . Colon surgery    . Tubal ligation    . Bladder surgery    . Abdominal hysterectomy      partial hysterectomy due to fibroids  . Cholecystectomy    . Hernia repair      There were no vitals filed for this visit.      Subjective Assessment - 03/22/16 1348    Subjective Reports that shoulder is hurtng today and has been trying to work shoulder. Now reports numbness down LUE into hand.   Patient Stated Goals Use my left shoulder agin.   Currently in Pain? Yes   Pain Score 7    Pain Location Shoulder   Pain Orientation Left   Pain Descriptors / Indicators Aching;Sore   Pain Type Acute pain   Pain Onset More than a month ago   Pain Frequency Constant            [facility] PT Assessment - 03/22/16 0001    Assessment   Medical Diagnosis Adhesive capsulitis ofleft  shoulder.   Onset Date/Surgical Date 11/18/15   Hand Dominance Right   Next MD Visit 04/11/2016   Precautions   Precautions None   Restrictions   Weight Bearing Restrictions No                     [facility] Adult PT Treatment/Exercise - 03/22/16 0001    Modalities   Modalities Electrical Stimulation;Cryotherapy   Cryotherapy   Number Minutes Cryotherapy 15 Minutes   Cryotherapy Location Shoulder   Type of Cryotherapy Ice pack   Electrical Stimulation   Electrical Stimulation Location L shouder   Electrical Stimulation Action IFC   Electrical Stimulation Parameters 1-10 hz x15 min   Electrical Stimulation Goals Pain   Manual Therapy   Manual Therapy Passive ROM   Passive ROM PROM of L shoulder into flex/ER/IR with gentle holds at end range                  PT Short Term Goals - 03/22/16 1419    PT SHORT TERM GOAL #1   Title Ind with a HEP.   Time 2   Period Weeks   Status Partially Met  PT Long Term Goals - 03/22/16 1419    PT LONG TERM GOAL #1   Title Active left shoulder flexion to 155 degrees so the patient can easily reach overhead   Time 4   Period Weeks   Status On-going   PT LONG TERM GOAL #2   Title Active ER to 80 degrees+ to allow for easily donning/doffing of apparel   Time 4   Period Weeks   Status On-going   PT LONG TERM GOAL #3   Title Increase ROM so patient is able to reach behind back to L3.   Time 4   Period Weeks   Status On-going   PT LONG TERM GOAL #4   Title Increase left shoulder strength to a solid 4+/5 to increase stability for performance of functional activities   Time 4   Period Weeks   Status On-going   PT LONG TERM GOAL #5   Title Perform ADL's with left shoulder pain not > 3/10.   Status On-going               Plan - 03/22/16 1416    Clinical Impression Statement Patient presented in clinic with increased L shoulder pain per patient report. Patient reported during PROM of L shoulder into  flexion and ER that she feels a "crunching" sensation and like "bones are bumping into each other." Firm and tight end feel noted with PROM of L shoulder into flexion noted today. Firm but normal end feel noted with PROM of L shoulder into ER also noted. Normal modaliites response noted following removal of the modalities. No goals have been achieved at this time secondary to pain per patient report.   Rehab Potential Good   PT Frequency 3x / week   PT Duration 4 weeks   PT Treatment/Interventions ADLs/Self Care Home Management;Cryotherapy;Electrical Stimulation;Moist Heat;Therapeutic exercise;Therapeutic activities;Ultrasound;Patient/family education;Manual techniques;Passive range of motion;Dry needling   PT Next Visit Plan Continue with TE, STW/MFR, and modalties for pain. Initiate DN if approved. Recert for DN sent 03/06/16.   PT Home Exercise Plan AAROM - flex, ext, IR; cervical stretches; scapular retraction.   Consulted and Agree with Plan of Care Patient      Patient will benefit from skilled therapeutic intervention in order to improve the following deficits and impairments:  Pain, Decreased activity tolerance, Decreased range of motion, Decreased strength  Visit Diagnosis: Stiffness of left shoulder, not elsewhere classified  Pain in left shoulder     Problem List Patient Active Problem List   Diagnosis Date Noted  . Adhesive capsulitis of left shoulder 03/20/2016  . CAP (community acquired pneumonia) 11/08/2015  . Acute respiratory failure with hypoxia (HCC) 11/08/2015  . Abnormal thyroid function test 11/02/2015  . Hyperlipidemia 11/02/2015  . History of tobacco use 11/02/2015  . Myalgia and myositis 11/02/2015  . Bipolar 1 disorder (HCC) 09/22/2015  . HTN (hypertension) 09/22/2015  . RLS (restless legs syndrome) 09/22/2015  . Chronic hepatitis C (HCC) 09/22/2015  . sensory trigeminal neuropathy 09/22/2015    Kelsey M Parsons, PTA 03/22/2016, 2:42 PM  [facility] [address] [place], [place], [address] Phone: [phone]   Fax:  [phone]  Name: Umaiza Steyer MRN: [id number] Date of Birth: 02/24/1956     

## 2016-03-22 NOTE — Telephone Encounter (Signed)
Faxed and notified pt on VM. 

## 2016-03-23 DIAGNOSIS — Z0271 Encounter for disability determination: Secondary | ICD-10-CM

## 2016-03-26 ENCOUNTER — Ambulatory Visit: Payer: Medicare Other | Admitting: Physical Therapy

## 2016-03-26 DIAGNOSIS — M25612 Stiffness of left shoulder, not elsewhere classified: Secondary | ICD-10-CM

## 2016-03-26 DIAGNOSIS — M25512 Pain in left shoulder: Secondary | ICD-10-CM

## 2016-03-26 NOTE — Therapy (Signed)
[facility] [place], [place], [address] Phone: [phone]   Fax:  [phone]  Physical Therapy Treatment  Patient Details  Name: Catherine Farrell MRN: [id number] Date of Birth: 06-May-1956 Referring Provider: Justice Britain MD.  Encounter Date: 03/26/2016      PT End of Session - 03/26/16 1115    Visit Number 5   Number of Visits 12   Date for PT Re-Evaluation 03/26/16   PT Start Time 1116   PT Stop Time 1212   PT Time Calculation (min) 56 min   Activity Tolerance Patient tolerated treatment well;Patient limited by pain   Behavior During Therapy [facility] for tasks assessed/performed      Past Medical History  Diagnosis Date  . Blood transfusion without reported diagnosis   . Depression   . Hypertension   . Bipolar 1 disorder ([place])   . Restless leg   . Hepatitis C   . Neuromuscular disorder ([place])   . PNA (pneumonia) 08/2009    viral  . Shingles 06/2013    was outside acute window for treatment, started on gabapentin  . Cholelithiasis 10/03/2015    GB US 01/11/2014 was NEGATIVE for gallstones, wall thickening or pericholecystic fluid.     Past Surgical History  Procedure Laterality Date  . Colon surgery    . Tubal ligation    . Bladder surgery    . Abdominal hysterectomy      partial hysterectomy due to fibroids  . Cholecystectomy    . Hernia repair      There were no vitals filed for this visit.      Subjective Assessment - 03/26/16 1116    Subjective The shoulder is still hurting. Sometimes it gets worse with the exercise. She continues to report numbness intermittently into L foream and into hand.   Patient Stated Goals Use my left shoulder agin.   Currently in Pain? Yes   Pain Score 7    Pain Location Shoulder   Pain Orientation Left   Pain Descriptors / Indicators Aching;Sore   Pain Type Acute pain   Pain Onset More than a month ago   Pain Frequency Constant   Aggravating Factors  movement of  shoulder   Pain Relieving Factors rest                         [facility] Adult PT Treatment/Exercise - 03/26/16 0001    Shoulder Exercises: Seated   Retraction Strengthening;Both;20 reps   Shoulder Exercises: ROM/Strengthening   Other ROM/Strengthening Exercises pulleys for flexion x 5 min   Modalities   Modalities Electrical Stimulation;Moist Heat   Moist Heat Therapy   Number Minutes Moist Heat 15 Minutes   Moist Heat Location Shoulder   Electrical Stimulation   Electrical Stimulation Location L shoulder    Electrical Stimulation Action IFC   Electrical Stimulation Parameters 80-150 hz to tolerance x 15 min   Electrical Stimulation Goals Pain   Manual Therapy   Manual Therapy Soft tissue mobilization   Soft tissue mobilization to L UT, lev scap, infraspinatus and rhomboids          Trigger Point Dry Needling - 03/26/16 1202    Consent Given? Yes   Education Handout Provided Yes   Muscles Treated Upper Body Upper trapezius;Levator scapulae;Infraspinatus   Upper Trapezius Response Twitch reponse elicited;Palpable increased muscle length   Levator Scapulae Response Twitch response elicited;Palpable increased muscle length   Infraspinatus Response Twitch response elicited;Palpable increased  muscle length              PT Education - 03/26/16 1203    Education provided Yes   Education Details DN education and aftercare.   Person(s) Educated Patient   Methods Explanation;Demonstration;Handout   Comprehension Verbalized understanding;Returned demonstration          PT Short Term Goals - 03/22/16 1419    PT SHORT TERM GOAL #1   Title Ind with a HEP.   Time 2   Period Weeks   Status Partially Met           PT Long Term Goals - 03/22/16 1419    PT LONG TERM GOAL #1   Title Active left shoulder flexion to 155 degrees so the patient can easily reach overhead   Time 4   Period Weeks   Status On-going   PT LONG TERM GOAL #2   Title Active ER to  80 degrees+ to allow for easily donning/doffing of apparel   Time 4   Period Weeks   Status On-going   PT LONG TERM GOAL #3   Title Increase ROM so patient is able to reach behind back to L3.   Time 4   Period Weeks   Status On-going   PT LONG TERM GOAL #4   Title Increase left shoulder strength to a solid 4+/5 to increase stability for performance of functional activities   Time 4   Period Weeks   Status On-going   PT LONG TERM GOAL #5   Title Perform ADL's with left shoulder pain not > 3/10.   Status On-going               Plan - 03/26/16 1204    Clinical Impression Statement Patient tolerated DN well with reports of relief afterwards to 5/10. She continues to be limited with therex tolerance which DN may help. Patient will benefit from further DN to L pecs and deltoids, but withheld today to see initial response. Goals are ongoing.   PT Next Visit Plan assess DN; continue to L pecs and delts as tolerated. ROM.      Patient will benefit from skilled therapeutic intervention in order to improve the following deficits and impairments:  Pain, Decreased activity tolerance, Decreased range of motion, Decreased strength  Visit Diagnosis: Pain in left shoulder  Stiffness of left shoulder, not elsewhere classified     Problem List Patient Active Problem List   Diagnosis Date Noted  . Adhesive capsulitis of left shoulder 03/20/2016  . CAP (community acquired pneumonia) 11/08/2015  . Acute respiratory failure with hypoxia ([place]) 11/08/2015  . Abnormal thyroid function test 11/02/2015  . Hyperlipidemia 11/02/2015  . History of tobacco use 11/02/2015  . Myalgia and myositis 11/02/2015  . Bipolar 1 disorder ([place]) 09/22/2015  . HTN (hypertension) 09/22/2015  . RLS (restless legs syndrome) 09/22/2015  . Chronic hepatitis C ([place]) 09/22/2015  . sensory trigeminal neuropathy 09/22/2015    Madelyn Flavors PT  03/26/2016, 12:18 PM  [facility] [address] [place], [place], [address] Phone: [phone]   Fax:  [phone]  Name: Catherine Farrell MRN: [id number] Date of Birth: [birth year]

## 2016-03-26 NOTE — Patient Instructions (Signed)
Trigger Point Dry Needling  . What is Trigger Point Dry Needling (DN)? o DN is a physical therapy technique used to treat muscle pain and dysfunction. Specifically, DN helps deactivate muscle trigger points (muscle knots).  o A thin filiform needle is used to penetrate the skin and stimulate the underlying trigger point. The goal is for a local twitch response (LTR) to occur and for the trigger point to relax. No medication of any kind is injected during the procedure.   . What Does Trigger Point Dry Needling Feel Like?  o The procedure feels different for each individual patient. Some patients report that they do not actually feel the needle enter the skin and overall the process is not painful. Very mild bleeding may occur. However, many patients feel a deep cramping in the muscle in which the needle was inserted. This is the local twitch response.   Marland Kitchen. How Will I feel after the treatment? o Soreness is normal, and the onset of soreness may not occur for a few hours. Typically this soreness does not last longer than two days.  o Bruising is uncommon, however; ice can be used to decrease any possible bruising.  o In rare cases feeling tired or nauseous after the treatment is normal. In addition, your symptoms may get worse before they get better, this period will typically not last longer than 24 hours.   . What Can I do After My Treatment? o Increase your hydration by drinking more water for the next 24 hours. o You may place ice or heat on the areas treated that have become sore, however, do not use heat on inflamed or bruised areas. Heat often brings more relief post needling. o You can continue your regular activities, but vigorous activity is not recommended initially after the treatment for 24 hours. o DN is best combined with other physical therapy such as strengthening, stretching, and other therapies.    Catherine PalmJulie Kamen Farrell, PT 03/26/2016 11:26 AM [facility] [address] [place], [place], [address] Phone: [phone]   Fax:  [phone]

## 2016-03-27 ENCOUNTER — Ambulatory Visit: Payer: Medicare Other | Admitting: Physical Therapy

## 2016-03-27 DIAGNOSIS — M25512 Pain in left shoulder: Secondary | ICD-10-CM

## 2016-03-27 DIAGNOSIS — M25612 Stiffness of left shoulder, not elsewhere classified: Secondary | ICD-10-CM | POA: Diagnosis not present

## 2016-03-27 NOTE — Therapy (Signed)
[facility] [place], [place], [address] Phone: [phone]   Fax:  [phone]  Physical Therapy Treatment  Patient Details  Name: Catherine Farrell MRN: [id number] Date of Birth: [birth year] Referring Provider: Justice Britain MD.  Encounter Date: 03/27/2016      PT End of Session - 03/27/16 0951    Visit Number 6   Number of Visits 24   Date for PT Re-Evaluation 04/24/16   Authorization Type Gcodes required   PT Start Time 3254   PT Stop Time 1050   PT Time Calculation (min) 58 min      Past Medical History  Diagnosis Date  . Blood transfusion without reported diagnosis   . Depression   . Hypertension   . Bipolar 1 disorder ([place])   . Restless leg   . Hepatitis C   . Neuromuscular disorder ([place])   . PNA (pneumonia) 08/2009    viral  . Shingles 06/2013    was outside acute window for treatment, started on gabapentin  . Cholelithiasis 10/03/2015    GB US 01/11/2014 was NEGATIVE for gallstones, wall thickening or pericholecystic fluid.     Past Surgical History  Procedure Laterality Date  . Colon surgery    . Tubal ligation    . Bladder surgery    . Abdominal hysterectomy      partial hysterectomy due to fibroids  . Cholecystectomy    . Hernia repair      There were no vitals filed for this visit.      Subjective Assessment - 03/27/16 0952    Subjective I'm just a little sore wear I was needled.   Patient Stated Goals Use my left shoulder agin.   Currently in Pain? Yes   Pain Score 6    Pain Location Shoulder   Pain Orientation Left   Pain Descriptors / Indicators Aching;Sore   Pain Type Acute pain   Pain Onset More than a month ago   Pain Frequency Constant   Aggravating Factors  movement of shoulder   Pain Relieving Factors rest            [facility] PT Assessment - 03/27/16 0001    ROM / Strength   AROM / PROM / Strength AROM;PROM   AROM   AROM Assessment Site Shoulder   Right/Left Shoulder  Left   Left Shoulder Flexion 130 Degrees  145 passive   Left Shoulder ABduction 165 Degrees   Left Shoulder Internal Rotation 40 Degrees  62 passive   Left Shoulder External Rotation 55 Degrees  58 deg passive                     [facility] Adult PT Treatment/Exercise - 03/27/16 0001    Shoulder Exercises: Standing   Other Standing Exercises [place] 4 with yellow band x 10 each   Modalities   Modalities Electrical Stimulation;Moist Heat   Moist Heat Therapy   Number Minutes Moist Heat 15 Minutes   Moist Heat Location Shoulder   Electrical Stimulation   Electrical Stimulation Location L shoulder    Electrical Stimulation Action IFC   Electrical Stimulation Parameters 80-150 Hz to tolerance x 15 min   Electrical Stimulation Goals Pain   Manual Therapy   Manual Therapy Soft tissue mobilization;Joint mobilization;Passive ROM   Joint Mobilization L GH joint gd I/II mobs and oscillations for pain control   Soft tissue mobilization L deltoids, pecs and lateral scapular border   Passive ROM to L shoulder  all planes          Trigger Point Dry Needling - 03/26/16 1202    Consent Given? Yes   Education Handout Provided Yes   Muscles Treated Upper Body Upper trapezius;Levator scapulae;Infraspinatus   Upper Trapezius Response Twitch reponse elicited;Palpable increased muscle length   Levator Scapulae Response Twitch response elicited;Palpable increased muscle length   Infraspinatus Response Twitch response elicited;Palpable increased muscle length              PT Education - 03/26/16 1203    Education provided Yes   Education Details DN education and aftercare.   Person(s) Educated Patient   Methods Explanation;Demonstration;Handout   Comprehension Verbalized understanding;Returned demonstration          PT Short Term Goals - 03/22/16 1419    PT SHORT TERM GOAL #1   Title Ind with a HEP.   Time 2   Period Weeks   Status Partially Met           PT  Long Term Goals - 03/22/16 1419    PT LONG TERM GOAL #1   Title Active left shoulder flexion to 155 degrees so the patient can easily reach overhead   Time 4   Period Weeks   Status On-going   PT LONG TERM GOAL #2   Title Active ER to 80 degrees+ to allow for easily donning/doffing of apparel   Time 4   Period Weeks   Status On-going   PT LONG TERM GOAL #3   Title Increase ROM so patient is able to reach behind back to L3.   Time 4   Period Weeks   Status On-going   PT LONG TERM GOAL #4   Title Increase left shoulder strength to a solid 4+/5 to increase stability for performance of functional activities   Time 4   Period Weeks   Status On-going   PT LONG TERM GOAL #5   Title Perform ADL's with left shoulder pain not > 3/10.   Status On-going               Plan - 03/27/16 1225    Clinical Impression Statement Patient tolerated DN well today as well as PROM. She is progressing with ROM. Patient was also able to tolerate [place] 4 today, however with minimal reps. All goals are ongoing.Patient reported decreased pain to 5/10 prior to estim/heat.   Rehab Potential Good   PT Frequency 3x / week   PT Duration 4 weeks   PT Treatment/Interventions ADLs/Self Care Home Management;Cryotherapy;Electrical Stimulation;Moist Heat;Therapeutic exercise;Therapeutic activities;Ultrasound;Patient/family education;Manual techniques;Passive range of motion;Dry needling;Neuromuscular re-education   PT Next Visit Plan Continue with ROM, gentle strengthening as tolerated. modalities for pain. DN as indicated.   PT Home Exercise Plan AAROM - flex, ext, IR; cervical stretches; scapular retraction.   Consulted and Agree with Plan of Care Patient      Patient will benefit from skilled therapeutic intervention in order to improve the following deficits and impairments:  Pain, Decreased activity tolerance, Decreased range of motion, Decreased strength  Visit Diagnosis: Stiffness of left shoulder,  not elsewhere classified  Pain in left shoulder     Problem List Patient Active Problem List   Diagnosis Date Noted  . Adhesive capsulitis of left shoulder 03/20/2016  . CAP (community acquired pneumonia) 11/08/2015  . Acute respiratory failure with hypoxia ([place]) 11/08/2015  . Abnormal thyroid function test 11/02/2015  . Hyperlipidemia 11/02/2015  . History of tobacco use 11/02/2015  . Myalgia and myositis  11/02/2015  . Bipolar 1 disorder ([place]) 09/22/2015  . HTN (hypertension) 09/22/2015  . RLS (restless legs syndrome) 09/22/2015  . Chronic hepatitis C ([place]) 09/22/2015  . sensory trigeminal neuropathy 09/22/2015    Madelyn Flavors PT  03/27/2016, 12:31 PM  [facility] [address] [place], [place], [address] Phone: [phone]   Fax:  [phone]  Name: Haylen Bellotti MRN: [id number] Date of Birth: 1956-05-04

## 2016-04-02 ENCOUNTER — Encounter: Payer: Medicare Other | Admitting: Physical Therapy

## 2016-04-05 ENCOUNTER — Encounter: Payer: Self-pay | Admitting: Physical Therapy

## 2016-04-05 ENCOUNTER — Ambulatory Visit: Payer: Medicare Other | Admitting: Physical Therapy

## 2016-04-05 DIAGNOSIS — M25612 Stiffness of left shoulder, not elsewhere classified: Secondary | ICD-10-CM

## 2016-04-05 DIAGNOSIS — M25512 Pain in left shoulder: Secondary | ICD-10-CM

## 2016-04-05 NOTE — Therapy (Signed)
[facility] [place], [place], [address] Phone: [phone]   Fax:  [phone]  Physical Therapy Treatment  Patient Details  Name: Atira Borello MRN: [id number] Date of Birth: 06-05-1956 Referring Provider: Justice Britain MD.  Encounter Date: 04/05/2016      PT End of Session - 04/05/16 1118    Visit Number 7   Number of Visits 24   Date for PT Re-Evaluation 04/24/16   PT Start Time 1118   PT Stop Time 1202   PT Time Calculation (min) 44 min   Activity Tolerance Patient tolerated treatment well   Behavior During Therapy [facility] for tasks assessed/performed      Past Medical History  Diagnosis Date  . Blood transfusion without reported diagnosis   . Depression   . Hypertension   . Bipolar 1 disorder ([place])   . Restless leg   . Hepatitis C   . Neuromuscular disorder ([place])   . PNA (pneumonia) 08/2009    viral  . Shingles 06/2013    was outside acute window for treatment, started on gabapentin  . Cholelithiasis 10/03/2015    GB US 01/11/2014 was NEGATIVE for gallstones, wall thickening or pericholecystic fluid.     Past Surgical History  Procedure Laterality Date  . Colon surgery    . Tubal ligation    . Bladder surgery    . Abdominal hysterectomy      partial hysterectomy due to fibroids  . Cholecystectomy    . Hernia repair      There were no vitals filed for this visit.      Subjective Assessment - 04/05/16 1112    Subjective States that the dry needling worked and gave her some relief for a few days but her shoulder is hurting her today. Sees Dr. 04/11/2016   Patient Stated Goals Use my left shoulder agin.   Currently in Pain? Yes   Pain Score 6    Pain Location Shoulder   Pain Orientation Left   Pain Descriptors / Indicators Sore;Aching;Numbness   Pain Type Acute pain   Pain Onset More than a month ago   Pain Frequency Intermittent            [facility] PT Assessment - 04/05/16 0001    Assessment    Medical Diagnosis Adhesive capsulitis ofleft shoulder.   Onset Date/Surgical Date 11/18/15   Hand Dominance Right   Next MD Visit 04/11/2016   Precautions   Precautions None   Restrictions   Weight Bearing Restrictions No                     [facility] Adult PT Treatment/Exercise - 04/05/16 0001    Modalities   Modalities Electrical Stimulation;Moist Heat   Moist Heat Therapy   Number Minutes Moist Heat 15 Minutes   Moist Heat Location Shoulder   Electrical Stimulation   Electrical Stimulation Location L shoulder    Electrical Stimulation Action IFC   Electrical Stimulation Parameters 1-10 hz x15 min   Electrical Stimulation Goals Pain   Manual Therapy   Manual Therapy Passive ROM   Passive ROM PROM of L shoulder into flexion/ER with gentle holds at end range                  PT Short Term Goals - 03/22/16 1419    PT SHORT TERM GOAL #1   Title Ind with a HEP.   Time 2   Period Weeks   Status Partially Met  PT Long Term Goals - 03/22/16 1419    PT LONG TERM GOAL #1   Title Active left shoulder flexion to 155 degrees so the patient can easily reach overhead   Time 4   Period Weeks   Status On-going   PT LONG TERM GOAL #2   Title Active ER to 80 degrees+ to allow for easily donning/doffing of apparel   Time 4   Period Weeks   Status On-going   PT LONG TERM GOAL #3   Title Increase ROM so patient is able to reach behind back to L3.   Time 4   Period Weeks   Status On-going   PT LONG TERM GOAL #4   Title Increase left shoulder strength to a solid 4+/5 to increase stability for performance of functional activities   Time 4   Period Weeks   Status On-going   PT LONG TERM GOAL #5   Title Perform ADL's with left shoulder pain not > 3/10.   Status On-going               Plan - 04/05/16 1157    Clinical Impression Statement Patient arrived to treatment with increased L shoulder pain today. Experienced L shoulder pain relief  with dry needling completed in previous treatments per patient report. Firm end feels noted with PROM of L shoulder into flexion and ER with smooth arc of motion noted with both directions. Normal modalities response noted following removal of the modalities. Patient reported experiencing 4-5/10 L shoulder pain upon end of treatment today.   Rehab Potential Good   PT Frequency 3x / week   PT Duration 4 weeks   PT Treatment/Interventions ADLs/Self Care Home Management;Cryotherapy;Electrical Stimulation;Moist Heat;Therapeutic exercise;Therapeutic activities;Ultrasound;Patient/family education;Manual techniques;Passive range of motion;Dry needling;Neuromuscular re-education   PT Next Visit Plan Continue with ROM, gentle strengthening as tolerated. modalities for pain. DN as indicated.   PT Home Exercise Plan AAROM - flex, ext, IR; cervical stretches; scapular retraction.   Consulted and Agree with Plan of Care Patient      Patient will benefit from skilled therapeutic intervention in order to improve the following deficits and impairments:  Pain, Decreased activity tolerance, Decreased range of motion, Decreased strength  Visit Diagnosis: Stiffness of left shoulder, not elsewhere classified  Pain in left shoulder     Problem List Patient Active Problem List   Diagnosis Date Noted  . Adhesive capsulitis of left shoulder 03/20/2016  . CAP (community acquired pneumonia) 11/08/2015  . Acute respiratory failure with hypoxia ([place]) 11/08/2015  . Abnormal thyroid function test 11/02/2015  . Hyperlipidemia 11/02/2015  . History of tobacco use 11/02/2015  . Myalgia and myositis 11/02/2015  . Bipolar 1 disorder ([place]) 09/22/2015  . HTN (hypertension) 09/22/2015  . RLS (restless legs syndrome) 09/22/2015  . Chronic hepatitis C ([place]) 09/22/2015  . sensory trigeminal neuropathy 09/22/2015    Wynelle Fanny, PTA 04/05/2016, 12:04 PM  [facility] [address] [place], [place], [address] Phone: [phone]   Fax:  [phone]  Name: Kalena Mander MRN: [id number] Date of Birth: 08/30/1956

## 2016-04-09 ENCOUNTER — Ambulatory Visit: Payer: Medicare Other | Attending: Orthopedic Surgery | Admitting: Physical Therapy

## 2016-04-09 DIAGNOSIS — M25612 Stiffness of left shoulder, not elsewhere classified: Secondary | ICD-10-CM | POA: Diagnosis not present

## 2016-04-09 DIAGNOSIS — M25512 Pain in left shoulder: Secondary | ICD-10-CM | POA: Diagnosis not present

## 2016-04-09 NOTE — Therapy (Signed)
[facility] [place], [place], [address] Phone: [phone]   Fax:  [phone]  Physical Therapy Treatment  Patient Details  Name: Catherine Farrell MRN: [id number] Date of Birth: [birth year] Referring Provider: Justice Britain MD.  Encounter Date: 04/09/2016      PT End of Session - 04/09/16 1559    Visit Number 8   Number of Visits 24   Date for PT Re-Evaluation 04/24/16   PT Start Time 7902   PT Stop Time 1604   PT Time Calculation (min) 47 min   Activity Tolerance Patient tolerated treatment well   Behavior During Therapy [facility] for tasks assessed/performed      Past Medical History  Diagnosis Date  . Blood transfusion without reported diagnosis   . Depression   . Hypertension   . Bipolar 1 disorder ([place])   . Restless leg   . Hepatitis C   . Neuromuscular disorder ([place])   . PNA (pneumonia) 08/2009    viral  . Shingles 06/2013    was outside acute window for treatment, started on gabapentin  . Cholelithiasis 10/03/2015    GB US 01/11/2014 was NEGATIVE for gallstones, wall thickening or pericholecystic fluid.     Past Surgical History  Procedure Laterality Date  . Colon surgery    . Tubal ligation    . Bladder surgery    . Abdominal hysterectomy      partial hysterectomy due to fibroids  . Cholecystectomy    . Hernia repair      There were no vitals filed for this visit.      Subjective Assessment - 04/09/16 1558    Subjective States that she would like dry needling to be done one more time. Reports that her shoulder is achey, sore today.   Patient Stated Goals Use my left shoulder agin.   Currently in Pain? Yes   Pain Score 6    Pain Location Shoulder   Pain Orientation Left   Pain Descriptors / Indicators Aching;Sore   Pain Type Acute pain   Pain Onset More than a month ago            [facility] PT Assessment - 04/09/16 0001    Assessment   Medical Diagnosis Adhesive capsulitis ofleft shoulder.   Onset Date/Surgical Date 11/18/15   Hand Dominance Right   Next MD Visit 04/11/2016   Precautions   Precautions None   Restrictions   Weight Bearing Restrictions No   ROM / Strength   AROM / PROM / Strength AROM;Strength   AROM   Overall AROM  Deficits   AROM Assessment Site Shoulder   Right/Left Shoulder Left   Left Shoulder Flexion 129 Degrees  supine; flexion in standing 130 deg   Left Shoulder ABduction 140 Degrees  scaption   Left Shoulder Internal Rotation 40 Degrees  supine   Left Shoulder External Rotation 73 Degrees  supine   PROM   PROM Assessment Site --   Strength   Overall Strength Deficits   Strength Assessment Site Shoulder   Right/Left Shoulder Left   Left Shoulder Flexion 3+/5   Left Shoulder Internal Rotation 3+/5   Left Shoulder External Rotation 3+/5                     [facility] Adult PT Treatment/Exercise - 04/09/16 0001    Modalities   Modalities Electrical Stimulation;Moist Heat   Moist Heat Therapy   Number Minutes Moist Heat 15 Minutes   Moist  Heat Location Shoulder   Electrical Stimulation   Electrical Stimulation Location L shoulder    Electrical Stimulation Action Pre-Mod   Electrical Stimulation Parameters 80-150 hz x15 min   Electrical Stimulation Goals Pain   Manual Therapy   Manual Therapy Passive ROM;Joint mobilization   Joint Mobilization Gentle L glenohumeral joint mobilizations grade I-II A/P/I for pain control   Passive ROM PROM of L shoulder into flexion/ER with gentle holds at end range                  PT Short Term Goals - 03/22/16 1419    PT SHORT TERM GOAL #1   Title Ind with a HEP.   Time 2   Period Weeks   Status Partially Met           PT Long Term Goals - 04/09/16 1544    PT LONG TERM GOAL #1   Title Active left shoulder flexion to 155 degrees so the patient can easily reach overhead   Time 4   Period Weeks   Status On-going   PT LONG TERM GOAL #2   Title Active ER to 80 degrees+ to  allow for easily donning/doffing of apparel   Time 4   Period Weeks   Status On-going   PT LONG TERM GOAL #3   Title Increase ROM so patient is able to reach behind back to L3.   Time 4   Period Weeks   Status On-going   PT LONG TERM GOAL #4   Title Increase left shoulder strength to a solid 4+/5 to increase stability for performance of functional activities   Time 4   Period Weeks   Status On-going   PT LONG TERM GOAL #5   Title Perform ADL's with left shoulder pain not > 3/10.   Status On-going  6-7/10 with ADLs               Plan - 04/09/16 1713    Clinical Impression Statement Patient continues to present in clinic with decreased L shoulder ROM and strength. Patient has recieved dry needling previously in clinic to which she reported improvement afterward although pain returned. Firm end feels noted with PROM of L shoulder into flexion/ER/IR with smooth arc of motion. Gentle grade I-II glenoumeral joint mobilizations were completed for pain control. AROM measurements measured similarly than taken during previous treatment with an improvement noted in L shoulder ER. Strength measured as 3+/5 today in flexion/ER/IR today with patient reporting pain with pressure. Normal modalities response noted following removal of the modalities.   Rehab Potential Good   PT Frequency 3x / week   PT Duration 4 weeks   PT Treatment/Interventions ADLs/Self Care Home Management;Cryotherapy;Electrical Stimulation;Moist Heat;Therapeutic exercise;Therapeutic activities;Ultrasound;Patient/family education;Manual techniques;Passive range of motion;Dry needling;Neuromuscular re-education   PT Next Visit Plan Continue with ROM, gentle strengthening as tolerated. modalities for pain. DN as indicated.   PT Home Exercise Plan AAROM - flex, ext, IR; cervical stretches; scapular retraction.   Consulted and Agree with Plan of Care Patient      Patient will benefit from skilled therapeutic intervention in  order to improve the following deficits and impairments:  Pain, Decreased activity tolerance, Decreased range of motion, Decreased strength  Visit Diagnosis: Stiffness of left shoulder, not elsewhere classified  Pain in left shoulder     Problem List Patient Active Problem List   Diagnosis Date Noted  . Adhesive capsulitis of left shoulder 03/20/2016  . CAP (community acquired pneumonia) 11/08/2015  .  Acute respiratory failure with hypoxia ([place]) 11/08/2015  . Abnormal thyroid function test 11/02/2015  . Hyperlipidemia 11/02/2015  . History of tobacco use 11/02/2015  . Myalgia and myositis 11/02/2015  . Bipolar 1 disorder ([place]) 09/22/2015  . HTN (hypertension) 09/22/2015  . RLS (restless legs syndrome) 09/22/2015  . Chronic hepatitis C ([place]) 09/22/2015  . sensory trigeminal neuropathy 09/22/2015    Ahmed Prima, PTA 04/09/2016 5:32 PM Mali Applegate MPT [facility] [address] [place], [place], [address] Phone: [phone]   Fax:  [phone]  Name: Catherine Farrell MRN: [id number] Date of Birth: December 07, 1955

## 2016-04-11 ENCOUNTER — Ambulatory Visit: Payer: Medicare Other | Admitting: Physical Therapy

## 2016-04-11 DIAGNOSIS — M25612 Stiffness of left shoulder, not elsewhere classified: Secondary | ICD-10-CM | POA: Diagnosis not present

## 2016-04-11 DIAGNOSIS — M25512 Pain in left shoulder: Secondary | ICD-10-CM | POA: Diagnosis not present

## 2016-04-11 DIAGNOSIS — M7502 Adhesive capsulitis of left shoulder: Secondary | ICD-10-CM | POA: Diagnosis not present

## 2016-04-11 DIAGNOSIS — G8929 Other chronic pain: Secondary | ICD-10-CM | POA: Diagnosis not present

## 2016-04-11 NOTE — Patient Instructions (Signed)
You may do these one time daily right now.  Low Row: Single Arm   Face anchor in stride stance. Farrell up, pull arm back while squeezing shoulder blades together. Repeat 10__ times per set. Repeat with other arm. Do _2-3_ sets per session. Do 3__ sessions per week. Anchor Height: Waist  http://tub.exer.us/71   Copyright  VHI. All rights reserved.  Press: Thumb Up (Single Arm)   Face away from anchor in stride stance, leg forward opposite exercising arm. Press arm forward with thumb up. Repeat 10 times per set.  Do _2-3_ sets per session. Do _3_ sessions per week. Anchor Height: Chest  http://tub.exer.us/17   Copyright  VHI. All rights reserved.  Rotation: External (Single Arm)   Side toward anchor in shoulder width stance with elbow bent to 90, arm across mid-section. Thumb up, pull arm away from body, keeping elbow bent. Repeat _10_ times per set. Repeat with other arm. Do _2-3_ sets per session. Do _3_ sessions per week. Anchor Height: Waist  http://tub.exer.us/115   Copyright  VHI. All rights reserved.  Rotation: Internal (Single Arm)   Side toward anchor in shoulder width stance with elbow bent to 90, forearm away from body. Thumb up, pull arm across body keeping elbow bent. Repeat _10_ times per set. Repeat with other arm. Do _2-3_ sets per session. Do _3_ sessions per week. Anchor Height: Waist  PNF Strengthening: Resisted   Standing with resistive band around each hand, bring left down and right arm up and away, thumb back. Do both sides. Repeat 10____ times per set. Do _1-3___ sets per session. Do ___1_ sessions per day.  http://orth.exer.us/918   Copyright  VHI. All rights reserved.  Strengthening: Chest Pull - Resisted   With resistive band looped around each hand, and arms straight out in front, stretch band across chest. Repeat __10__ times per set. Do 1-3____ sets per session. Do _1___ sessions per day.   Catherine PalmJulie Kerrington Greenhalgh, PT 04/11/2016 12:05 PM    [facility] [address] [place], [place], [id number] Phone: [phone]   Fax:  [phone]

## 2016-04-11 NOTE — Therapy (Signed)
[facility] [address] [place], [place], [address] Phone: [phone]   Fax:  [phone]  Physical Therapy Treatment  Patient Details  Name: Catherine Farrell MRN: [id number] Date of Birth: 05-08-1956 Referring Provider: Francena Hanly MD.  Encounter Date: 04/11/2016      PT End of Session - 04/11/16 1205    Visit Number 9   Number of Visits 21   Date for PT Re-Evaluation 05/23/16   Authorization Type Gcodes required   PT Start Time 1120   PT Stop Time 1216   PT Time Calculation (min) 56 min   Activity Tolerance Patient tolerated treatment well   Behavior During Therapy [facility] for tasks assessed/performed      Past Medical History  Diagnosis Date  . Blood transfusion without reported diagnosis   . Depression   . Hypertension   . Bipolar 1 disorder ([facility])   . Restless leg   . Hepatitis C   . Neuromuscular disorder ([facility])   . PNA (pneumonia) 08/2009    viral  . Shingles 06/2013    was outside acute window for treatment, started on gabapentin  . Cholelithiasis 10/03/2015    GB US 01/11/2014 was NEGATIVE for gallstones, wall thickening or pericholecystic fluid.     Past Surgical History  Procedure Laterality Date  . Colon surgery    . Tubal ligation    . Bladder surgery    . Abdominal hysterectomy      partial hysterectomy due to fibroids  . Cholecystectomy    . Hernia repair      There were no vitals filed for this visit.      Subjective Assessment - 04/11/16 1121    Subjective Patient states that she does not feel her shoulder has made any improvements with therapy other than temporary pain relief. She continues to have intermittent numbness into the left hand. She also c/o of her shoulder and elbow popping.   Patient Stated Goals Use my left shoulder agin.   Currently in Pain? Yes   Pain Score 7    Pain Location Shoulder   Pain Orientation Left   Pain Descriptors / Indicators Aching;Sore   Pain Onset More than a  month ago   Pain Frequency Intermittent   Aggravating Factors  movement of shoulder   Pain Relieving Factors rest            [facility] PT Assessment - 04/11/16 0001    Assessment   Medical Diagnosis Adhesive capsulitis ofleft shoulder.   Next MD Visit 04/11/2016   AROM   Overall AROM  Deficits   AROM Assessment Site Shoulder  passive in comments   Right/Left Shoulder Left   Left Shoulder Flexion 146 Degrees  165 supine   Left Shoulder ABduction 160 Degrees  166 in supine   Left Shoulder Internal Rotation 59 Degrees  68   Left Shoulder External Rotation 74 Degrees  75   Strength   Overall Strength Deficits   Strength Assessment Site Shoulder   Right/Left Shoulder Left   Left Shoulder Flexion 4/5   Left Shoulder Extension 4+/5   Left Shoulder ABduction 4-/5   Left Shoulder Internal Rotation 4/5   Left Shoulder External Rotation 4-/5                     [facility] Adult PT Treatment/Exercise - 04/11/16 0001    Shoulder Exercises: Supine   Horizontal ABduction Strengthening;Left;20 reps;Theraband   Theraband Level (Shoulder Horizontal ABduction) Level 1 (Yellow)  Other Supine Exercises D1 and D2 ext with yellow band x 20 reps   Shoulder Exercises: Standing   Protraction Strengthening;Left;15 reps;Theraband   Theraband Level (Shoulder Protraction) Level 1 (Yellow)   External Rotation Strengthening;Left;15 reps;Theraband   Theraband Level (Shoulder External Rotation) Level 1 (Yellow)   Internal Rotation Strengthening;Left;15 reps;Theraband   Theraband Level (Shoulder Internal Rotation) Level 1 (Yellow)   Row Strengthening;Left;15 reps;Theraband   Theraband Level (Shoulder Row) Level 1 (Yellow)   Shoulder Exercises: Stretch   External Rotation Stretch 1 rep;60 seconds  supine in Y position and hand behind head    Modalities   Modalities Electrical Stimulation   Moist Heat Therapy   Number Minutes Moist Heat 15 Minutes   Moist Heat Location Shoulder   Electrical  Stimulation   Electrical Stimulation Location L shoulder    Electrical Stimulation Action IFC   Electrical Stimulation Parameters 80-150 Hz to tolerance x 15 min   Electrical Stimulation Goals Pain   Manual Therapy   Manual Therapy Passive ROM   Passive ROM to L shoulder into flex/abd, IR and ER                  PT Short Term Goals - 04/11/16 1123    PT SHORT TERM GOAL #1   Title Ind with a HEP.   Time 2   Period Weeks   Status Achieved           PT Long Term Goals - 04/11/16 1123    PT LONG TERM GOAL #1   Title Active left shoulder flexion to 155 degrees so the patient can easily reach overhead   Time 4   Period Weeks   Status On-going   PT LONG TERM GOAL #2   Title Active ER to 80 degrees+ to allow for easily donning/doffing of apparel   Time 4   Period Weeks   Status On-going   PT LONG TERM GOAL #3   Title Increase ROM so patient is able to reach behind back to L3.   Baseline only to L5 as of 04/11/16   Time 4   Period Weeks   Status On-going   PT LONG TERM GOAL #4   Title Increase left shoulder strength to a solid 4+/5 to increase stability for performance of functional activities   Time 4   Period Weeks   Status On-going   PT LONG TERM GOAL #5   Title Perform ADL's with left shoulder pain not > 3/10.   Time 4   Period Weeks   Status On-going               Plan - 04/11/16 1214    Clinical Impression Statement Patient states that she has not made any improvements in regards to pain, however she has gained in ROM and strength overall. Not sure if patient gives full effort wtth MMT. Possibly fear of pain. Today she tolerated therex very well, moreso than any previous visit, with only mild increase in pain. She demos tighness in anterior RC limiting IR which is her greatest deficit. She did receive some temporary relief with TPDN at previous visits, but did not wish to have it done today.     Rehab Potential Good   PT Frequency 2x / week   PT  Duration 6 weeks   PT Treatment/Interventions ADLs/Self Care Home Management;Cryotherapy;Electrical Stimulation;Moist Heat;Therapeutic exercise;Therapeutic activities;Ultrasound;Patient/family education;Manual techniques;Passive range of motion;Dry needling;Neuromuscular re-education   PT Next Visit Plan GCODE FOR 10TH VISIT. Await MD orders.  continu with ROM, strengthening and modalities for pain.   PT Home Exercise Plan [place] 4, scauplar unattached diagonals and horizontal ABD; AAROM - flex, ext, IR; cervical stretches; scapular retraction.   Consulted and Agree with Plan of Care Patient      Patient will benefit from skilled therapeutic intervention in order to improve the following deficits and impairments:  Pain, Decreased activity tolerance, Decreased range of motion, Decreased strength  Visit Diagnosis: Stiffness of left shoulder, not elsewhere classified - Plan: PT plan of care cert/re-cert  Pain in left shoulder - Plan: PT plan of care cert/re-cert     Problem List Patient Active Problem List   Diagnosis Date Noted  . Adhesive capsulitis of left shoulder 03/20/2016  . CAP (community acquired pneumonia) 11/08/2015  . Acute respiratory failure with hypoxia ([facility]) 11/08/2015  . Abnormal thyroid function test 11/02/2015  . Hyperlipidemia 11/02/2015  . History of tobacco use 11/02/2015  . Myalgia and myositis 11/02/2015  . Bipolar 1 disorder ([facility]) 09/22/2015  . HTN (hypertension) 09/22/2015  . RLS (restless legs syndrome) 09/22/2015  . Chronic hepatitis C ([facility]) 09/22/2015  . sensory trigeminal neuropathy 09/22/2015    Solon PalmJulie Ladawn Boullion PT  04/11/2016, 12:29 PM  [facility] [address] [place], [place], [address] Phone: [phone]   Fax:  [phone]  Name: Beatriz StallionCarolyn Olesky MRN: [id number] Date of Birth: 06/26/1956

## 2016-04-16 ENCOUNTER — Encounter: Payer: Self-pay | Admitting: Physical Therapy

## 2016-04-16 ENCOUNTER — Ambulatory Visit: Payer: Medicare Other | Admitting: Physical Therapy

## 2016-04-16 DIAGNOSIS — M25512 Pain in left shoulder: Secondary | ICD-10-CM

## 2016-04-16 DIAGNOSIS — M25612 Stiffness of left shoulder, not elsewhere classified: Secondary | ICD-10-CM

## 2016-04-16 NOTE — Therapy (Signed)
[facility] [address] [place], [place], [address] Phone: [phone]   Fax:  [phone]  Physical Therapy Treatment  Patient Details  Name: Catherine Farrell MRN: [id number] Date of Birth: 1956/06/16 Referring Provider: Francena Hanly MD.  Encounter Date: 04/16/2016      PT End of Session - 04/16/16 1437    Visit Number 10   Number of Visits 21   Date for PT Re-Evaluation 05/23/16   PT Start Time 1432   PT Stop Time 1519   PT Time Calculation (min) 47 min   Activity Tolerance Patient tolerated treatment well   Behavior During Therapy [facility] for tasks assessed/performed      Past Medical History  Diagnosis Date  . Blood transfusion without reported diagnosis   . Depression   . Hypertension   . Bipolar 1 disorder (HCC)   . Restless leg   . Hepatitis C   . Neuromuscular disorder (HCC)   . PNA (pneumonia) 08/2009    viral  . Shingles 06/2013    was outside acute window for treatment, started on gabapentin  . Cholelithiasis 10/03/2015    GB US 01/11/2014 was NEGATIVE for gallstones, wall thickening or pericholecystic fluid.     Past Surgical History  Procedure Laterality Date  . Colon surgery    . Tubal ligation    . Bladder surgery    . Abdominal hysterectomy      partial hysterectomy due to fibroids  . Cholecystectomy    . Hernia repair      There were no vitals filed for this visit.      Subjective Assessment - 04/16/16 1437    Subjective Reports that her shoulder is doing good today. Reports she is to have an MRI this friday on her shoulder per [facility].    Patient Stated Goals Use my left shoulder agin.   Currently in Pain? Yes   Pain Score 6    Pain Location Shoulder   Pain Orientation Left   Pain Descriptors / Indicators Sore;Discomfort   Pain Type Acute pain   Pain Onset More than a month ago            [facility] PT Assessment - 04/16/16 0001    Assessment   Medical Diagnosis Adhesive  capsulitis ofleft shoulder.   Precautions   Precautions None   Restrictions   Weight Bearing Restrictions No                     [facility] Adult PT Treatment/Exercise - 04/16/16 0001    Shoulder Exercises: Supine   Horizontal ABduction Strengthening;Both;20 reps;Theraband   Theraband Level (Shoulder Horizontal ABduction) Level 1 (Yellow)   External Rotation Strengthening;Both;20 reps;Theraband   Theraband Level (Shoulder External Rotation) Level 1 (Yellow)   Other Supine Exercises D2 ext with yellow band x 20 reps   Shoulder Exercises: Standing   External Rotation Strengthening;Left;Theraband  3x10 reps   Theraband Level (Shoulder External Rotation) Level 1 (Yellow)   Internal Rotation Strengthening;Left;Theraband  3x10 reps   Theraband Level (Shoulder Internal Rotation) Level 1 (Yellow)   Extension Strengthening;Left;Theraband  3x10 reps   Theraband Level (Shoulder Extension) Level 1 (Yellow)   Row Strengthening;Left;Theraband  3x10 reps   Theraband Level (Shoulder Row) Level 1 (Yellow)   Shoulder Exercises: Pulleys   Flexion Other (comment)  x5 min   Shoulder Exercises: Stretch   External Rotation Stretch 3 reps;30 seconds   Modalities   Modalities Electrical Stimulation;Moist Heat   Moist Heat  Therapy   Number Minutes Moist Heat 15 Minutes   Moist Heat Location Shoulder   Electrical Stimulation   Electrical Stimulation Location L shoulder    Electrical Stimulation Action Pre-Mod   Electrical Stimulation Parameters 80-150 hz x15 min   Electrical Stimulation Goals Pain                  PT Short Term Goals - 04/11/16 1123    PT SHORT TERM GOAL #1   Title Ind with a HEP.   Time 2   Period Weeks   Status Achieved           PT Long Term Goals - 04/11/16 1123    PT LONG TERM GOAL #1   Title Active left shoulder flexion to 155 degrees so the patient can easily reach overhead   Time 4   Period Weeks   Status On-going   PT LONG TERM GOAL #2    Title Active ER to 80 degrees+ to allow for easily donning/doffing of apparel   Time 4   Period Weeks   Status On-going   PT LONG TERM GOAL #3   Title Increase ROM so patient is able to reach behind back to L3.   Baseline only to L5 as of 04/11/16   Time 4   Period Weeks   Status On-going   PT LONG TERM GOAL #4   Title Increase left shoulder strength to a solid 4+/5 to increase stability for performance of functional activities   Time 4   Period Weeks   Status On-going   PT LONG TERM GOAL #5   Title Perform ADL's with left shoulder pain not > 3/10.   Time 4   Period Weeks   Status On-going               Plan - 05/04/2016 1508    Clinical Impression Statement Patient tolerated today's treatment well although she arrived to treatment with 6/10 L shoulder pain. Patient able to complete all exercises directed to her with no reports of pain. Patient noted that she did not know if she could complete ER stretch with hands behind her head but able to tolerate three reps for 30 seconds. Patient reported L shoulder popping with supine ER with yellow theraband. Normal modalities response noted following removal of the modaliites. No goals were achieved at this time secondary to ROM, strength deficits as well as pain per patient report. Patient experienced L shoulder feeling "better" upon end of treatment.   Rehab Potential Good   PT Frequency 2x / week   PT Duration 6 weeks   PT Treatment/Interventions ADLs/Self Care Home Management;Cryotherapy;Electrical Stimulation;Moist Heat;Therapeutic exercise;Therapeutic activities;Ultrasound;Patient/family education;Manual techniques;Passive range of motion;Dry needling;Neuromuscular re-education   PT Next Visit Plan Continue with ROM, strengthening and modalities per MPT POC.   PT Home Exercise Plan Rockwood 4, scauplar unattached diagonals and horizontal ABD; AAROM - flex, ext, IR; cervical stretches; scapular retraction.   Consulted and Agree with  Plan of Care Patient      Patient will benefit from skilled therapeutic intervention in order to improve the following deficits and impairments:  Pain, Decreased activity tolerance, Decreased range of motion, Decreased strength  Visit Diagnosis: Stiffness of left shoulder, not elsewhere classified  Pain in left shoulder       G-Codes - 04-May-2016 1651    Functional Assessment Tool Used FOTO...10th visit 61% limitation.   Functional Limitation Other PT primary   Other PT Primary Current Status ([id number]) At least  60 percent but less than 80 percent impaired, limited or restricted      Problem List Patient Active Problem List   Diagnosis Date Noted  . Adhesive capsulitis of left shoulder 03/20/2016  . CAP (community acquired pneumonia) 11/08/2015  . Acute respiratory failure with hypoxia (HCC) 11/08/2015  . Abnormal thyroid function test 11/02/2015  . Hyperlipidemia 11/02/2015  . History of tobacco use 11/02/2015  . Myalgia and myositis 11/02/2015  . Bipolar 1 disorder (HCC) 09/22/2015  . HTN (hypertension) 09/22/2015  . RLS (restless legs syndrome) 09/22/2015  . Chronic hepatitis C (HCC) 09/22/2015  . sensory trigeminal neuropathy 09/22/2015    APPLEGATE, ItalyHAD, PTA 04/16/2016, 4:51 PM [place] MPT [facility] [address] [place], [place], [address] Phone: [phone]   Fax:  [phone]  Name: Beatriz StallionCarolyn Duva MRN: [id number] Date of Birth: 03/12/1956

## 2016-04-18 ENCOUNTER — Encounter: Payer: Medicare Other | Admitting: Physical Therapy

## 2016-04-20 DIAGNOSIS — M25512 Pain in left shoulder: Secondary | ICD-10-CM | POA: Diagnosis not present

## 2016-04-20 DIAGNOSIS — G8929 Other chronic pain: Secondary | ICD-10-CM | POA: Diagnosis not present

## 2016-04-23 ENCOUNTER — Encounter: Payer: Medicare Other | Admitting: Physical Therapy

## 2016-05-01 ENCOUNTER — Ambulatory Visit: Payer: Medicare Other | Admitting: Physical Therapy

## 2016-05-01 ENCOUNTER — Encounter: Payer: Self-pay | Admitting: Physical Therapy

## 2016-05-01 DIAGNOSIS — M25512 Pain in left shoulder: Secondary | ICD-10-CM

## 2016-05-01 DIAGNOSIS — M25612 Stiffness of left shoulder, not elsewhere classified: Secondary | ICD-10-CM | POA: Diagnosis not present

## 2016-05-01 NOTE — Therapy (Signed)
[facility] [address] [place], [place], [address] Phone: [phone]   Fax:  [phone]  Physical Therapy Treatment  Patient Details  Name: Catherine Farrell MRN: [id number] Date of Birth: January 18, 1956 Referring Provider: Francena Hanly MD.  Encounter Date: 05/01/2016      PT End of Session - 05/01/16 0812    Visit Number 11   Number of Visits 21   Date for PT Re-Evaluation 05/23/16   PT Start Time 0815   PT Stop Time 0858   PT Time Calculation (min) 43 min   Activity Tolerance Patient tolerated treatment well   Behavior During Therapy [facility] for tasks assessed/performed      Past Medical History:  Diagnosis Date  . Bipolar 1 disorder (HCC)   . Blood transfusion without reported diagnosis   . Cholelithiasis 10/03/2015   GB US 01/11/2014 was NEGATIVE for gallstones, wall thickening or pericholecystic fluid.   . Depression   . Hepatitis C   . Hypertension   . Neuromuscular disorder (HCC)   . PNA (pneumonia) 08/2009   viral  . Restless leg   . Shingles 06/2013   was outside acute window for treatment, started on gabapentin    Past Surgical History:  Procedure Laterality Date  . ABDOMINAL HYSTERECTOMY     partial hysterectomy due to fibroids  . BLADDER SURGERY    . CHOLECYSTECTOMY    . COLON SURGERY    . HERNIA REPAIR    . TUBAL LIGATION      There were no vitals filed for this visit.      Subjective Assessment - 05/01/16 0812    Subjective Reports that her shoulder is sore today and hurting. Unable to go to appt at [facility] yesterday due to family issues but has another appt 05/07/2016 that she will get her results from MRI and said she may get another steriod shot.   Patient Stated Goals Use my left shoulder agin.   Currently in Pain? Yes   Pain Score 7    Pain Location Shoulder   Pain Orientation Left   Pain Descriptors / Indicators Sore;Discomfort   Pain Type Acute pain   Pain Onset More than a month  ago            [facility] PT Assessment - 05/01/16 0001      Assessment   Medical Diagnosis Adhesive capsulitis ofleft shoulder.   Onset Date/Surgical Date 11/18/15     Precautions   Precautions None     Restrictions   Weight Bearing Restrictions No                     [facility] Adult PT Treatment/Exercise - 05/01/16 0001      Shoulder Exercises: Standing   External Rotation Strengthening;Left;15 reps;Theraband   Theraband Level (Shoulder External Rotation) Level 1 (Yellow)   Extension Strengthening;Left;20 reps;Theraband   Theraband Level (Shoulder Extension) Level 1 (Yellow)   Row Strengthening;Left;20 reps;Theraband   Theraband Level (Shoulder Row) Level 1 (Yellow)     Shoulder Exercises: Pulleys   Flexion Other (comment)  x5 min   Other Pulley Exercises UE ranger flex/ cricles x20 reps     Modalities   Modalities Electrical Stimulation;Moist Heat     Moist Heat Therapy   Number Minutes Moist Heat 15 Minutes   Moist Heat Location Shoulder     Electrical Stimulation   Electrical Stimulation Location L shoulder    Electrical Stimulation Action Pre-Mod   Electrical Stimulation Parameters 80-150  hz x15 min   Electrical Stimulation Goals Pain     Manual Therapy   Manual Therapy Soft tissue mobilization   Soft tissue mobilization STW/MFR to L Deltoids, UT, Tricep to decrease tightness and pain                  PT Short Term Goals - 04/11/16 1123      PT SHORT TERM GOAL #1   Title Ind with a HEP.   Time 2   Period Weeks   Status Achieved           PT Long Term Goals - 04/11/16 1123      PT LONG TERM GOAL #1   Title Active left shoulder flexion to 155 degrees so the patient can easily reach overhead   Time 4   Period Weeks   Status On-going     PT LONG TERM GOAL #2   Title Active ER to 80 degrees+ to allow for easily donning/doffing of apparel   Time 4   Period Weeks   Status On-going     PT LONG TERM GOAL #3   Title Increase  ROM so patient is able to reach behind back to L3.   Baseline only to L5 as of 04/11/16   Time 4   Period Weeks   Status On-going     PT LONG TERM GOAL #4   Title Increase left shoulder strength to a solid 4+/5 to increase stability for performance of functional activities   Time 4   Period Weeks   Status On-going     PT LONG TERM GOAL #5   Title Perform ADL's with left shoulder pain not > 3/10.   Time 4   Period Weeks   Status On-going               Plan - 05/01/16 0846    Clinical Impression Statement Patient tolerated today's treatment fairly well as she arrived with increased pain in L shoulder. Patient limited with therapeutic exercise secondary to pain. Patient began experiencing pain in L shoulder with UE ranger in circles and limited with standing resisted ER with yellow theraband secondary to pain. Patient presented with tightness in L Deltoids, Tricep and UT today upon palpation. Patient experienced soreness with palpation over L Triceps and Deltoids today per patient report. Normal modalities response noted following removal of the modalities.   Rehab Potential Good   PT Frequency 2x / week   PT Duration 6 weeks   PT Treatment/Interventions ADLs/Self Care Home Management;Cryotherapy;Electrical Stimulation;Moist Heat;Therapeutic exercise;Therapeutic activities;Ultrasound;Patient/family education;Manual techniques;Passive range of motion;Dry needling;Neuromuscular re-education   PT Next Visit Plan Continue with ROM, strengthening and modalities per MPT POC.   PT Home Exercise Plan [place] 4, scauplar unattached diagonals and horizontal ABD; AAROM - flex, ext, IR; cervical stretches; scapular retraction.   Consulted and Agree with Plan of Care Patient      Patient will benefit from skilled therapeutic intervention in order to improve the following deficits and impairments:  Pain, Decreased activity tolerance, Decreased range of motion, Decreased strength  Visit  Diagnosis: Stiffness of left shoulder, not elsewhere classified  Pain in left shoulder     Problem List Patient Active Problem List   Diagnosis Date Noted  . Adhesive capsulitis of left shoulder 03/20/2016  . CAP (community acquired pneumonia) 11/08/2015  . Acute respiratory failure with hypoxia (HCC) 11/08/2015  . Abnormal thyroid function test 11/02/2015  . Hyperlipidemia 11/02/2015  . History of tobacco use 11/02/2015  .  Myalgia and myositis 11/02/2015  . Bipolar 1 disorder (HCC) 09/22/2015  . HTN (hypertension) 09/22/2015  . RLS (restless legs syndrome) 09/22/2015  . Chronic hepatitis C (HCC) 09/22/2015  . sensory trigeminal neuropathy 09/22/2015    Evelene Croon, PTA 05/01/2016, 9:03 AM  [facility] [address] [place], [place], [address] Phone: [phone]   Fax:  [phone]  Name: Brendalyn Vallely MRN: [id number] Date of Birth: 1956-02-03

## 2016-05-03 ENCOUNTER — Ambulatory Visit: Payer: Medicare Other | Admitting: Physical Therapy

## 2016-05-15 ENCOUNTER — Ambulatory Visit: Payer: Medicare Other | Attending: Orthopedic Surgery | Admitting: Physical Therapy

## 2016-05-15 ENCOUNTER — Encounter: Payer: Self-pay | Admitting: Physical Therapy

## 2016-05-15 DIAGNOSIS — M25612 Stiffness of left shoulder, not elsewhere classified: Secondary | ICD-10-CM | POA: Diagnosis not present

## 2016-05-15 DIAGNOSIS — M25512 Pain in left shoulder: Secondary | ICD-10-CM | POA: Diagnosis not present

## 2016-05-15 NOTE — Therapy (Addendum)
[facility] [place], [place], [address] Phone: [phone]   Fax:  [phone]  Physical Therapy Treatment  Patient Details  Name: Catherine Farrell MRN: [id number] Date of Birth: [birth year] Referring Provider: Justice Britain MD.  Encounter Date: 05/15/2016      PT End of Session - 05/15/16 0811    Visit Number 12   Number of Visits 21   Date for PT Re-Evaluation 05/23/16   PT Start Time 0818   PT Stop Time 0907   PT Time Calculation (min) 49 min   Activity Tolerance Patient limited by pain   Behavior During Therapy [facility] for tasks assessed/performed      Past Medical History:  Diagnosis Date  . Bipolar 1 disorder ([place])   . Blood transfusion without reported diagnosis   . Cholelithiasis 10/03/2015   GB US 01/11/2014 was NEGATIVE for gallstones, wall thickening or pericholecystic fluid.   . Depression   . Hepatitis C   . Hypertension   . Neuromuscular disorder ([place])   . PNA (pneumonia) 08/2009   viral  . Restless leg   . Shingles 06/2013   was outside acute window for treatment, started on gabapentin    Past Surgical History:  Procedure Laterality Date  . ABDOMINAL HYSTERECTOMY     partial hysterectomy due to fibroids  . BLADDER SURGERY    . CHOLECYSTECTOMY    . COLON SURGERY    . HERNIA REPAIR    . TUBAL LIGATION      There were no vitals filed for this visit.      Subjective Assessment - 05/15/16 0811    Subjective Reports that she hasn't been able to get her results back from MRI due to circumstances with her sister. Requests that this be her last treatment secondary to having a lot on her right now and increased stress.   Patient Stated Goals Use my left shoulder agin.   Currently in Pain? Yes   Pain Score 7    Pain Location Shoulder   Pain Orientation Left   Pain Descriptors / Indicators Dull;Aching   Pain Onset More than a month ago            [facility] PT Assessment - 05/15/16 0001       Assessment   Medical Diagnosis Adhesive capsulitis ofleft shoulder.   Onset Date/Surgical Date 11/18/15   Next MD Visit 05/29/2016     Precautions   Precautions None     Restrictions   Weight Bearing Restrictions No     ROM / Strength   AROM / PROM / Strength AROM     AROM   Overall AROM  Deficits   AROM Assessment Site Shoulder   Right/Left Shoulder Left   Left Shoulder Flexion 110 Degrees   Left Shoulder Internal Rotation 64 Degrees   Left Shoulder External Rotation 30 Degrees     Strength   Overall Strength Unable to assess;Due to pain                     [facility] Adult PT Treatment/Exercise - 05/15/16 0001      Modalities   Modalities Electrical Stimulation;Moist Heat;Ultrasound     Moist Heat Therapy   Number Minutes Moist Heat 15 Minutes   Moist Heat Location Shoulder     Electrical Stimulation   Electrical Stimulation Location L shoulder    Electrical Stimulation Action IFC   Electrical Stimulation Parameters 1-10 hz x15 min   Electrical Stimulation Goals  Pain     Ultrasound   Ultrasound Location L anterior shoulder   Ultrasound Parameters 1.2 w/cm2, 100%, 3.3 mhz x10 min   Ultrasound Goals Pain     Manual Therapy   Manual Therapy Soft tissue mobilization;Passive ROM   Soft tissue mobilization STW to L Bicep/ Deltoids to decrease pain and soreness   Passive ROM PROM of L shoulder into flexion and ER with gentle holds at end range                  PT Short Term Goals - 04/11/16 1123      PT SHORT TERM GOAL #1   Title Ind with a HEP.   Time 2   Period Weeks   Status Achieved           PT Long Term Goals - 05/15/16 0859      PT LONG TERM GOAL #1   Title Active left shoulder flexion to 155 degrees so the patient can easily reach overhead   Time 4   Period Weeks   Status Not Met  AROM L shoulder flex 110 deg in supine 05/15/2016     PT LONG TERM GOAL #2   Title Active ER to 80 degrees+ to allow for easily donning/doffing  of apparel   Time 4   Period Weeks   Status Not Met  AROM L shouder 30 deg 05/15/2016     PT LONG TERM GOAL #3   Title Increase ROM so patient is able to reach behind back to L3.   Baseline only to L5 as of 04/11/16   Time 4   Period Weeks   Status Not Met  Able to actively reach beltline 05/15/2016     PT LONG TERM GOAL #4   Title Increase left shoulder strength to a solid 4+/5 to increase stability for performance of functional activities   Time 4   Period Weeks   Status Unable to assess  Unable to assess secondary to pain experienced by patient 8//05/2016     PT LONG TERM GOAL #5   Title Perform ADL's with left shoulder pain not > 3/10.   Time 4   Period Weeks   Status Not Met  6-7/10 L shoulder pain with ADLs per patient report 05/15/2016               Plan - 05/15/16 0901    Clinical Impression Statement Patient was limited by pain today during treatment. Patient arrived to treatment with request that today be her last treatment and had high level L shoulder pain per patient report. Normal modalites response noted following removal of the modalities. Patient experienced soreness in anterior L shoulder per report by patient. Minimal tightness noted in L Tricep region and posterior shoulder but no abnormal tightness noted in anterior shoulder. AROM measurements of L shoulder limited secondary to pain today with 110 deg of flexion measured in supine. AROM L shoulder IR 64 deg, ER measured as 30 deg in supine today. Unable to assess MMT measurements due to L shoulder pain today. Patient requested for PROM of L shoulder to be discontinued following thirteen minutes secondary to pain in L shoulder joint per patient report. Patient able to actively reach beltline with LUE and experienced 6-7/10 L shoulder pain with ADLs per patient report.   Rehab Potential Good   PT Frequency 2x / week   PT Duration 6 weeks   PT Treatment/Interventions ADLs/Self Care Home  Management;Cryotherapy;Electrical Stimulation;Moist Heat;Therapeutic exercise;Therapeutic activities;Ultrasound;Patient/family  education;Manual techniques;Passive range of motion;Dry needling;Neuromuscular re-education   PT Next Visit Plan D/C secondary to patient request due to stress at this time.   PT Home Exercise Plan [place] 4, scauplar unattached diagonals and horizontal ABD; AAROM - flex, ext, IR; cervical stretches; scapular retraction.   Consulted and Agree with Plan of Care Patient           G-Codes - 05/20/2016 1426    Functional Assessment Tool Used FOTO... 61% limitation.   Functional Limitation Other PT primary   Other PT Primary Goal Status [phone]) At least 20 percent but less than 40 percent impaired, limited or restricted   Other PT Primary Discharge Status [phone]) At least 60 percent but less than 80 percent impaired, limited or restricted         Patient will benefit from skilled therapeutic intervention in order to improve the following deficits and impairments:  Pain, Decreased activity tolerance, Decreased range of motion, Decreased strength  Visit Diagnosis: Stiffness of left shoulder, not elsewhere classified  Pain in left shoulder     Problem List Patient Active Problem List   Diagnosis Date Noted  . Adhesive capsulitis of left shoulder 03/20/2016  . CAP (community acquired pneumonia) 11/08/2015  . Acute respiratory failure with hypoxia ([place]) 11/08/2015  . Abnormal thyroid function test 11/02/2015  . Hyperlipidemia 11/02/2015  . History of tobacco use 11/02/2015  . Myalgia and myositis 11/02/2015  . Bipolar 1 disorder ([place]) 09/22/2015  . HTN (hypertension) 09/22/2015  . RLS (restless legs syndrome) 09/22/2015  . Chronic hepatitis C ([place]) 09/22/2015  . sensory trigeminal neuropathy 09/22/2015    Ahmed Prima, PTA 20-May-2016 9:20 AM   Laureen Abrahams, PT, DPT 05/16/16 7:30 AM    [facility] [address] [place], [place], [address] Phone: [phone]   Fax:  [phone]  Name: Catherine Farrell MRN: [id number] Date of Birth: 10/02/1956        PHYSICAL THERAPY DISCHARGE SUMMARY  Visits from Start of Care: 12  Current functional level related to goals / functional outcomes: See above   Remaining deficits: See above   Education / Equipment: HEP  Plan: Patient agrees to discharge.  Patient goals were not met. Patient is being discharged due to the patient's request.  ?????  Pt requesting d/c due to stress with family circumstances.        Laureen Abrahams, PT, DPT 05/16/16 7:28 AM  [facility] [address] [place], [place] [address]  [phone] (office) [phone] (fax)

## 2016-05-21 ENCOUNTER — Telehealth: Payer: Self-pay

## 2016-05-21 ENCOUNTER — Other Ambulatory Visit: Payer: Self-pay | Admitting: Physician Assistant

## 2016-05-21 NOTE — Telephone Encounter (Incomplete)
She will need to establish with another provider.  I will give her a taper. Please call this into the pharmacy and I will cosign.   Take 7.5 mg morning and night for 1 week (  tabs total).  Then take 5 mg morning and night for 1 week (14   Deliah BostonMichael Tomorrow Dehaas, MS, PA-C 1:31 PM, 05/21/2016

## 2016-05-21 NOTE — Telephone Encounter (Signed)
Pt needs refill on Diazepam. Catherine Farrell;s pt. I advised her that she would need to establish care with another provider. Please advise.

## 2016-05-21 NOTE — Telephone Encounter (Signed)
Please disregard the previous message.  Please call in the following. Take 7.5 mg every 12 hours for 1 week, then take 5 mg every 12 hours for 1 week, then take 2.5 mg every 12 hours for 1 week.  Quantity 42 tabs.  No refills without office visit. Deliah BostonMichael Clark, MS, PA-C 2:12 PM, 05/21/2016

## 2016-05-23 NOTE — Telephone Encounter (Signed)
Spoke with pt, advised her message from Casimiro NeedleMichael, she is just going to come in tomorrow to get her regular prescription. Advised her to call back after 4pm today.

## 2016-05-24 ENCOUNTER — Ambulatory Visit (INDEPENDENT_AMBULATORY_CARE_PROVIDER_SITE_OTHER): Payer: Medicare Other | Admitting: Urgent Care

## 2016-05-24 VITALS — BP 120/86 | HR 57 | Temp 98.0°F | Resp 16 | Ht 62.0 in | Wt 218.4 lb

## 2016-05-24 DIAGNOSIS — F418 Other specified anxiety disorders: Secondary | ICD-10-CM

## 2016-05-24 DIAGNOSIS — F329 Major depressive disorder, single episode, unspecified: Secondary | ICD-10-CM

## 2016-05-24 DIAGNOSIS — F319 Bipolar disorder, unspecified: Secondary | ICD-10-CM

## 2016-05-24 DIAGNOSIS — F419 Anxiety disorder, unspecified: Secondary | ICD-10-CM

## 2016-05-24 MED ORDER — QUETIAPINE FUMARATE 200 MG PO TABS: 200.0000 mg | ORAL_TABLET | Freq: Every day | ORAL | 2 refills | Status: AC

## 2016-05-24 MED ORDER — DIAZEPAM 10 MG PO TABS: 10.0000 mg | ORAL_TABLET | Freq: Every evening | ORAL | 1 refills | Status: AC | PRN

## 2016-05-24 NOTE — Patient Instructions (Addendum)
Quetiapine tablets What is this medicine? QUETIAPINE (kwe TYE a peen) is an antipsychotic. It is used to treat schizophrenia and bipolar disorder, also known as manic-depression. This medicine may be used for other purposes; ask your health care provider or pharmacist if you have questions. What should I tell my health care provider before I take this medicine? They need to know if you have any of these conditions: -brain tumor or head injury -breast cancer -cataracts -diabetes -difficulty swallowing -heart disease -kidney disease -liver disease -low blood counts, like low white cell, platelet, or red cell counts -low blood pressure or dizziness when standing up -Parkinson's disease -previous heart attack -seizures -suicidal thoughts, plans, or attempt by you or a family member -thyroid disease -an unusual or allergic reaction to quetiapine, other medicines, foods, dyes, or preservatives -pregnant or trying to get pregnant -breast-feeding How should I use this medicine? Take this medicine by mouth. Swallow it with a drink of water. Follow the directions on the prescription label. If it upsets your stomach you can take it with food. Take your medicine at regular intervals. Do not take it more often than directed. Do not stop taking except on the advice of your doctor or health care professional. A special MedGuide will be given to you by the pharmacist with each prescription and refill. Be sure to read this information carefully each time. Talk to your pediatrician regarding the use of this medicine in children. While this drug may be prescribed for children as young as 10 years for selected conditions, precautions do apply. Patients over age 59 years may have a stronger reaction to this medicine and need smaller doses. Overdosage: If you think you have taken too much of this medicine contact a poison control center or emergency room at once. NOTE: This medicine is only for you. Do not  share this medicine with others. What if I miss a dose? If you miss a dose, take it as soon as you can. If it is almost time for your next dose, take only that dose. Do not take double or extra doses. What may interact with this medicine? Do not take this medicine with any of the following medications: -certain medicines for fungal infections like fluconazole, itraconazole, ketoconazole, posaconazole, voriconazole -cisapride -dofetilide -dronedarone -droperidol -grepafloxacin -halofantrine -phenothiazines like chlorpromazine, mesoridazine, thioridazine -pimozide -sparfloxacin -ziprasidone This medicine may also interact with the following medications: -alcohol -antiviral medicines for HIV or AIDS -certain medicines for blood pressure -certain medicines for depression, anxiety, or psychotic disturbances like haloperidol, lorazepam -certain medicines for diabetes -certain medicines for Parkinson's disease -certain medicines for seizures like carbamazepine, phenobarbital, phenytoin -cimetidine -erythromycin -other medicines that prolong the QT interval (cause an abnormal heart rhythm) -rifampin -steroid medicines like prednisone or cortisone This list may not describe all possible interactions. Give your health care provider a list of all the medicines, herbs, non-prescription drugs, or dietary supplements you use. Also tell them if you smoke, drink alcohol, or use illegal drugs. Some items may interact with your medicine. What should I watch for while using this medicine? Visit your doctor or health care professional for regular checks on your progress. It may be several weeks before you see the full effects of this medicine. Your health care provider may suggest that you have your eyes examined prior to starting this medicine, and every 6 months thereafter. If you have been taking this medicine regularly for some time, do not suddenly stop taking it. You must gradually reduce the dose  or   your symptoms may get worse. Ask your doctor or health care professional for advice. Patients and their families should watch out for worsening depression or thoughts of suicide. Also watch out for sudden or severe changes in feelings such as feeling anxious, agitated, panicky, irritable, hostile, aggressive, impulsive, severely restless, overly excited and hyperactive, or not being able to sleep. If this happens, especially at the beginning of antidepressant treatment or after a change in dose, call your health care professional. Bonita Quin may get dizzy or drowsy. Do not drive, use machinery, or do anything that needs mental alertness until you know how this medicine affects you. Do not stand or sit up quickly, especially if you are an older patient. This reduces the risk of dizzy or fainting spells. Alcohol can increase dizziness and drowsiness. Avoid alcoholic drinks. Do not treat yourself for colds, diarrhea or allergies. Ask your doctor or health care professional for advice, some ingredients may increase possible side effects. This medicine can reduce the response of your body to heat or cold. Dress warm in cold weather and stay hydrated in hot weather. If possible, avoid extreme temperatures like saunas, hot tubs, very hot or cold showers, or activities that can cause dehydration such as vigorous exercise. What side effects may I notice from receiving this medicine? Side effects that you should report to your doctor or health care professional as soon as possible: -allergic reactions like skin rash, itching or hives, swelling of the face, lips, or tongue -difficulty swallowing -fast or irregular heartbeat -fever or chills, sore throat -fever with rash, swollen lymph nodes, or swelling of the face -increased hunger or thirst -increased urination -problems with balance, talking, walking -seizures -stiff muscles -suicidal thoughts or other mood changes -uncontrollable head, mouth, neck, arm, or  leg movements -unusually weak or tired Side effects that usually do not require medical attention (report to your doctor or health care professional if they continue or are bothersome): -change in sex drive or performance -constipation -drowsy or dizzy -dry mouth -stomach upset -weight gain This list may not describe all possible side effects. Call your doctor for medical advice about side effects. You may report side effects to FDA at 1-800-FDA-1088. Where should I keep my medicine? Keep out of the reach of children. Store at room temperature between 15 and 30 degrees C (59 and 86 degrees F). Throw away any unused medicine after the expiration date. NOTE: This sheet is a summary. It may not cover all possible information. If you have questions about this medicine, talk to your doctor, pharmacist, or health care provider.    2016, Elsevier/Gold Standard. (2015-03-29 13:07:35)    Diazepam tablets What is this medicine? DIAZEPAM (dye AZ e pam) is a benzodiazepine. It is used to treat anxiety and nervousness. It also can help treat alcohol withdrawal, relax muscles, and treat certain types of seizures. This medicine may be used for other purposes; ask your health care provider or pharmacist if you have questions. What should I tell my health care provider before I take this medicine? They need to know if you have any of these conditions -an alcohol or drug abuse problem -bipolar disorder, depression, psychosis or other mental health condition -glaucoma -kidney or liver disease -lung or breathing disease -myasthenia gravis -Parkinson's disease -seizures or a history of seizures -suicidal thoughts -an unusual or allergic reaction to diazepam, other benzodiazepines, foods, dyes, or preservatives -pregnant or trying to get pregnant -breast-feeding How should I use this medicine? Take this medicine by mouth with  a glass of water. Follow the directions on the prescription label. If this  medicine upsets your stomach, take it with food or milk. Take your doses at regular intervals. Do not take your medicine more often than directed. If you have been taking this medicine regularly for some time, do not suddenly stop taking it. You must gradually reduce the dose or you may get severe side effects. Ask your doctor or health care professional for advice. Even after you stop taking this medicine it can still affect your body for several days. Talk to your pediatrician regarding the use of this medicine in children. Special care may be needed. Overdosage: If you think you have taken too much of this medicine contact a poison control center or emergency room at once. NOTE: This medicine is only for you. Do not share this medicine with others. What if I miss a dose? If you miss a dose, take it as soon as you can. If it is almost time for your next dose, take only that dose. Do not take double or extra doses. What may interact with this medicine? -cimetidine -grapefruit juice -herbal or dietary supplements like kava kava, melatonin, St. John's Wort, or valerian -medicines for anxiety or sleeping problems, like alprazolam, lorazepam, or triazolam -medicines for depression, mental problems or psychiatric disturbances -medicines for HIV infection or AIDS -prescription pain medicines -rifampin, rifapentine, or rifabutin -some medicines for seizures like carbamazepine, phenobarbital, phenytoin, or primidone This list may not describe all possible interactions. Give your health care provider a list of all the medicines, herbs, non-prescription drugs, or dietary supplements you use. Also tell them if you smoke, drink alcohol, or use illegal drugs. Some items may interact with your medicine. What should I watch for while using this medicine? Visit your doctor or health care professional for regular checks on your progress. Your body can become dependent on this medicine. Ask your doctor or health  care professional if you still need to take it. You may get drowsy or dizzy. Do not drive, use machinery, or do anything that needs mental alertness until you know how this medicine affects you. To reduce the risk of dizzy and fainting spells, do not stand or sit up quickly, especially if you are an older patient. Alcohol may increase dizziness and drowsiness. Avoid alcoholic drinks. Do not treat yourself for coughs, colds or allergies without asking your doctor or health care professional for advice. Some ingredients can increase possible side effects. What side effects may I notice from receiving this medicine? Side effects that you should report to your doctor or health care professional as soon as possible: -allergic reactions like skin rash, itching or hives, swelling of the face, lips, or tongue -angry, confused, depressed, other mood changes -breathing problems -feeling faint or lightheaded, falls -muscle cramps -problems with balance, talking, walking -restlessness -tremors -trouble passing urine or change in the amount of urine -unusually weak or tired Side effects that usually do not require medical attention (report to your doctor or health care professional if they continue or are bothersome): -difficulty sleeping, nightmares -dizziness, drowsiness, clumsiness, or unsteadiness, a hangover effect -headache -nausea, vomiting This list may not describe all possible side effects. Call your doctor for medical advice about side effects. You may report side effects to FDA at 1-800-FDA-1088. Where should I keep my medicine? Keep out of the reach of children. This medicine can be abused. Keep your medicine in a safe place to protect it from theft. Do not share this  medicine with anyone. Selling or giving away this medicine is dangerous and against the law. This medicine may cause accidental overdose and death if taken by other adults, children, or pets. Mix any unused medicine with a  substance like cat litter or coffee grounds. Then throw the medicine away in a sealed container like a sealed bag or a coffee can with a lid. Do not use the medicine after the expiration date. Store at room temperature between 15 and 30 degrees C (59 and 86 degrees F). Protect from light. Keep container tightly closed. NOTE: This sheet is a summary. It may not cover all possible information. If you have questions about this medicine, talk to your doctor, pharmacist, or health care provider.    2016, Elsevier/Gold Standard. (2014-06-15 15:16:42)     IF you received an x-ray today, you will receive an invoice from [facility]. Please contact [facility] at [phone] with questions or concerns regarding your invoice.   IF you received labwork today, you will receive an invoice from [facility]. Please contact [facility] at [phone] with questions or concerns regarding your invoice.   Our billing staff will not be able to assist you with questions regarding bills from these companies.  You will be contacted with the lab results as soon as they are available. The fastest way to get your results is to activate your My Chart account. Instructions are located on the last page of this paperwork. If you have not heard from us regarding the results in 2 weeks, please contact this office.

## 2016-05-24 NOTE — Progress Notes (Signed)
    MRN: [id number] DOB: 08/27/1956  Subjective:   Catherine Farrell is a 60 y.o. female presenting for chief complaint of Medication Refill (Seroquel 200 mg, Diazepam 10 mg)  Patient has a history of Bipolar Disorder, managed with Seroquel 200mg . She also uses Diazepam 10mg  BID for panic attacks and general anxiety. Patient lives on her own, has family stressors, has a difficult and hurtful relationship with her mother. Her childhood was very difficult on her. Also had a sibling pass away in 2016, was very close to him. Her husband was also killed by a drunk driver [id number]. Reports that she has had longstanding diagnosis of bipolar disorder, has failed multiple treatments with medications like depakote, lithium. She is not currently seeing a psychiatrist or behavioral therapist. Has done well with Seroquel. She quit smoking 2013. Denies SI, HI.   Catherine Farrell has a current medication list which includes the following prescription(s): amlodipine, aspirin, cyclobenzaprine, diazepam, lisinopril-hydrochlorothiazide, metoprolol, omega-3 fatty acids, probiotic product, quetiapine, spironolactone, meloxicam, and naproxen. Also is allergic to codeine and tramadol.  Catherine Farrell  has a past medical history of Bipolar 1 disorder (HCC); Blood transfusion without reported diagnosis; Cholelithiasis (10/03/2015); Depression; Hepatitis C; Hypertension; Neuromuscular disorder (HCC); PNA (pneumonia) (08/2009); Restless leg; and Shingles (06/2013). Also  has a past surgical history that includes Colon surgery; Tubal ligation; Bladder surgery; Abdominal hysterectomy; Cholecystectomy; and Hernia repair.  Objective:   Vitals: BP 120/86 (BP Location: Right Arm, Patient Position: Sitting, Cuff Size: Large)   Pulse (!) 57   Temp 98 F (36.7 C) (Oral)   Resp 16   Ht 5\' 2"  (1.575 m)   Wt 218 lb 6.4 oz (99.1 kg)   SpO2 99%   BMI 39.95 kg/m   Physical Exam  Constitutional: She is oriented to person, place, and time. She appears  well-developed and well-nourished.  Cardiovascular: Normal rate.   Pulmonary/Chest: Effort normal.  Neurological: She is alert and oriented to person, place, and time.  Psychiatric: Her mood appears not anxious. Her affect is not blunt and not labile. Her speech is not rapid and/or pressured, not delayed and not tangential. She is not agitated and not slowed. She does not express impulsivity or inappropriate judgment. She does not exhibit a depressed mood. She expresses no homicidal and no suicidal ideation.   Assessment and Plan :   1. Bipolar 1 disorder (HCC) 2. Anxiety and depression - I discussed management of her Bipolar Disorder Type 1. She is agreeable to see a psychiatrist. I counseled her on the risks of using diazepam as much as she has. She is agreeable to cut back on her diazepam use to just once daily with the intention of stopping daily use of this medication going forward. Follow up only if she has not been scheduled for psychiatry referral.  Wallis BambergMario Britainy Kozub, PA-C [facility] [phone] 05/24/2016 11:51 AM

## 2016-06-06 DIAGNOSIS — M7502 Adhesive capsulitis of left shoulder: Secondary | ICD-10-CM | POA: Diagnosis not present

## 2016-06-06 DIAGNOSIS — M25512 Pain in left shoulder: Secondary | ICD-10-CM | POA: Diagnosis not present

## 2016-06-06 DIAGNOSIS — M7542 Impingement syndrome of left shoulder: Secondary | ICD-10-CM | POA: Diagnosis not present

## 2016-06-11 ENCOUNTER — Other Ambulatory Visit: Payer: Self-pay | Admitting: Physician Assistant

## 2016-06-11 DIAGNOSIS — I10 Essential (primary) hypertension: Secondary | ICD-10-CM

## 2016-06-21 ENCOUNTER — Other Ambulatory Visit: Payer: Self-pay | Admitting: Urgent Care

## 2016-06-25 DIAGNOSIS — R110 Nausea: Secondary | ICD-10-CM | POA: Diagnosis not present

## 2016-06-25 DIAGNOSIS — K219 Gastro-esophageal reflux disease without esophagitis: Secondary | ICD-10-CM | POA: Diagnosis not present

## 2016-06-25 DIAGNOSIS — R1032 Left lower quadrant pain: Secondary | ICD-10-CM | POA: Diagnosis not present

## 2016-06-25 DIAGNOSIS — N132 Hydronephrosis with renal and ureteral calculous obstruction: Secondary | ICD-10-CM | POA: Diagnosis not present

## 2016-06-25 DIAGNOSIS — B1920 Unspecified viral hepatitis C without hepatic coma: Secondary | ICD-10-CM | POA: Diagnosis not present

## 2016-06-25 DIAGNOSIS — N10 Acute tubulo-interstitial nephritis: Secondary | ICD-10-CM | POA: Diagnosis not present

## 2016-06-25 DIAGNOSIS — N390 Urinary tract infection, site not specified: Secondary | ICD-10-CM | POA: Diagnosis not present

## 2016-06-25 DIAGNOSIS — R109 Unspecified abdominal pain: Secondary | ICD-10-CM | POA: Diagnosis not present

## 2016-06-25 DIAGNOSIS — M797 Fibromyalgia: Secondary | ICD-10-CM | POA: Diagnosis not present

## 2016-06-25 DIAGNOSIS — I10 Essential (primary) hypertension: Secondary | ICD-10-CM | POA: Diagnosis not present

## 2016-06-25 DIAGNOSIS — N202 Calculus of kidney with calculus of ureter: Secondary | ICD-10-CM | POA: Diagnosis not present

## 2016-06-26 DIAGNOSIS — N390 Urinary tract infection, site not specified: Secondary | ICD-10-CM | POA: Diagnosis not present

## 2016-06-26 DIAGNOSIS — B1920 Unspecified viral hepatitis C without hepatic coma: Secondary | ICD-10-CM | POA: Diagnosis not present

## 2016-06-26 DIAGNOSIS — N201 Calculus of ureter: Secondary | ICD-10-CM | POA: Diagnosis not present

## 2016-06-26 DIAGNOSIS — Z466 Encounter for fitting and adjustment of urinary device: Secondary | ICD-10-CM | POA: Diagnosis not present

## 2016-06-26 DIAGNOSIS — M797 Fibromyalgia: Secondary | ICD-10-CM | POA: Diagnosis not present

## 2016-06-26 DIAGNOSIS — I10 Essential (primary) hypertension: Secondary | ICD-10-CM | POA: Diagnosis not present

## 2016-06-26 DIAGNOSIS — K219 Gastro-esophageal reflux disease without esophagitis: Secondary | ICD-10-CM | POA: Diagnosis not present

## 2016-06-26 DIAGNOSIS — N132 Hydronephrosis with renal and ureteral calculous obstruction: Secondary | ICD-10-CM | POA: Diagnosis not present

## 2016-06-29 DIAGNOSIS — N200 Calculus of kidney: Secondary | ICD-10-CM | POA: Diagnosis not present

## 2016-06-29 DIAGNOSIS — Z960 Presence of urogenital implants: Secondary | ICD-10-CM | POA: Diagnosis not present

## 2016-07-03 DIAGNOSIS — M797 Fibromyalgia: Secondary | ICD-10-CM | POA: Diagnosis not present

## 2016-07-03 DIAGNOSIS — Z79899 Other long term (current) drug therapy: Secondary | ICD-10-CM | POA: Diagnosis not present

## 2016-07-03 DIAGNOSIS — G2581 Restless legs syndrome: Secondary | ICD-10-CM | POA: Diagnosis not present

## 2016-07-03 DIAGNOSIS — Z87442 Personal history of urinary calculi: Secondary | ICD-10-CM | POA: Diagnosis not present

## 2016-07-03 DIAGNOSIS — Z886 Allergy status to analgesic agent status: Secondary | ICD-10-CM | POA: Diagnosis not present

## 2016-07-03 DIAGNOSIS — Z9049 Acquired absence of other specified parts of digestive tract: Secondary | ICD-10-CM | POA: Diagnosis not present

## 2016-07-03 DIAGNOSIS — Z01818 Encounter for other preprocedural examination: Secondary | ICD-10-CM | POA: Diagnosis not present

## 2016-07-03 DIAGNOSIS — E669 Obesity, unspecified: Secondary | ICD-10-CM | POA: Diagnosis not present

## 2016-07-03 DIAGNOSIS — I10 Essential (primary) hypertension: Secondary | ICD-10-CM | POA: Diagnosis not present

## 2016-07-03 DIAGNOSIS — Z801 Family history of malignant neoplasm of trachea, bronchus and lung: Secondary | ICD-10-CM | POA: Diagnosis not present

## 2016-07-03 DIAGNOSIS — F329 Major depressive disorder, single episode, unspecified: Secondary | ICD-10-CM | POA: Diagnosis not present

## 2016-07-03 DIAGNOSIS — Z90710 Acquired absence of both cervix and uterus: Secondary | ICD-10-CM | POA: Diagnosis not present

## 2016-07-03 DIAGNOSIS — N200 Calculus of kidney: Secondary | ICD-10-CM | POA: Diagnosis not present

## 2016-07-04 DIAGNOSIS — Z960 Presence of urogenital implants: Secondary | ICD-10-CM | POA: Diagnosis not present

## 2016-07-04 DIAGNOSIS — N201 Calculus of ureter: Secondary | ICD-10-CM | POA: Diagnosis not present

## 2016-07-04 DIAGNOSIS — N200 Calculus of kidney: Secondary | ICD-10-CM | POA: Diagnosis not present

## 2016-07-04 DIAGNOSIS — N202 Calculus of kidney with calculus of ureter: Secondary | ICD-10-CM | POA: Diagnosis not present

## 2016-07-19 DIAGNOSIS — F319 Bipolar disorder, unspecified: Secondary | ICD-10-CM | POA: Diagnosis not present

## 2016-07-19 DIAGNOSIS — I10 Essential (primary) hypertension: Secondary | ICD-10-CM | POA: Diagnosis not present

## 2016-07-19 DIAGNOSIS — R635 Abnormal weight gain: Secondary | ICD-10-CM | POA: Diagnosis not present

## 2016-08-16 DIAGNOSIS — F319 Bipolar disorder, unspecified: Secondary | ICD-10-CM | POA: Diagnosis not present

## 2016-08-16 DIAGNOSIS — R635 Abnormal weight gain: Secondary | ICD-10-CM | POA: Diagnosis not present

## 2016-08-16 DIAGNOSIS — I10 Essential (primary) hypertension: Secondary | ICD-10-CM | POA: Diagnosis not present

## 2016-09-11 DIAGNOSIS — E039 Hypothyroidism, unspecified: Secondary | ICD-10-CM | POA: Diagnosis not present

## 2016-09-11 DIAGNOSIS — F314 Bipolar disorder, current episode depressed, severe, without psychotic features: Secondary | ICD-10-CM | POA: Diagnosis not present

## 2016-09-11 DIAGNOSIS — I10 Essential (primary) hypertension: Secondary | ICD-10-CM | POA: Diagnosis not present

## 2016-10-12 DIAGNOSIS — M7542 Impingement syndrome of left shoulder: Secondary | ICD-10-CM | POA: Diagnosis not present

## 2016-10-30 DIAGNOSIS — M7542 Impingement syndrome of left shoulder: Secondary | ICD-10-CM | POA: Diagnosis not present

## 2016-10-30 DIAGNOSIS — M75112 Incomplete rotator cuff tear or rupture of left shoulder, not specified as traumatic: Secondary | ICD-10-CM | POA: Diagnosis not present

## 2016-10-30 DIAGNOSIS — M24112 Other articular cartilage disorders, left shoulder: Secondary | ICD-10-CM | POA: Diagnosis not present

## 2016-10-30 DIAGNOSIS — S43432A Superior glenoid labrum lesion of left shoulder, initial encounter: Secondary | ICD-10-CM | POA: Diagnosis not present

## 2016-10-30 DIAGNOSIS — G8918 Other acute postprocedural pain: Secondary | ICD-10-CM | POA: Diagnosis not present

## 2016-10-30 DIAGNOSIS — M19012 Primary osteoarthritis, left shoulder: Secondary | ICD-10-CM | POA: Diagnosis not present

## 2016-10-30 DIAGNOSIS — M94212 Chondromalacia, left shoulder: Secondary | ICD-10-CM | POA: Diagnosis not present

## 2016-11-06 IMAGING — US US ABDOMEN LIMITED
1 series · 13 of 25 positions shown · non-contrast
Comparison: None

CLINICAL DATA: RIGHT upper quadrant pain with nausea and vomiting
for 5-6 months, chronic epigastric pain, RIGHT upper quadrant
tenderness, hypertension, hepatitis-C

EXAM:
US ABDOMEN LIMITED - RIGHT UPPER QUADRANT

[Series 1: us abdomen limited · 0.30mm/px · 13 of 35 slices shown]
[im 1/35]
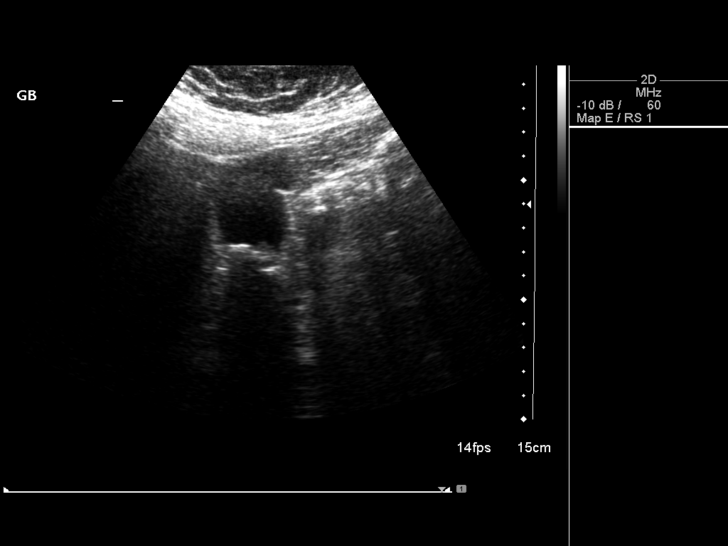
[im 3/35]
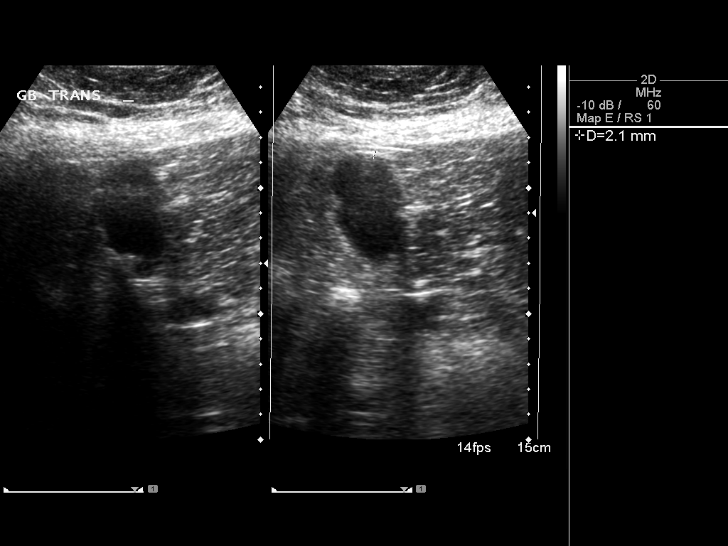
[im 6/35]
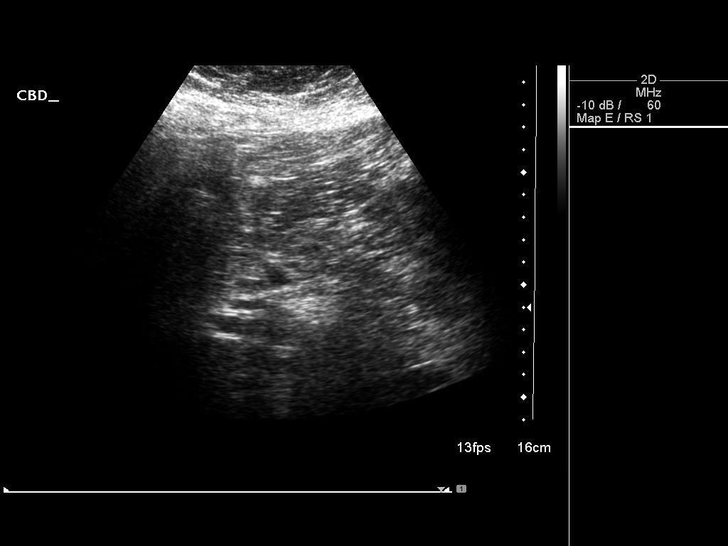
[im 9/35]
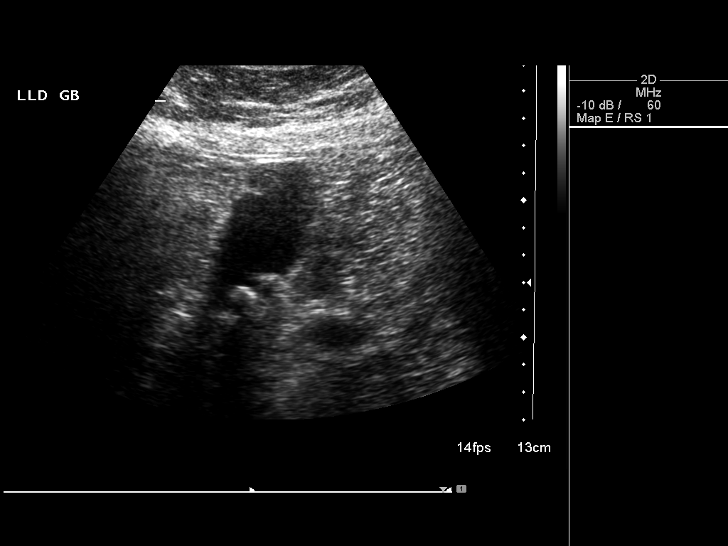
[im 12/35]
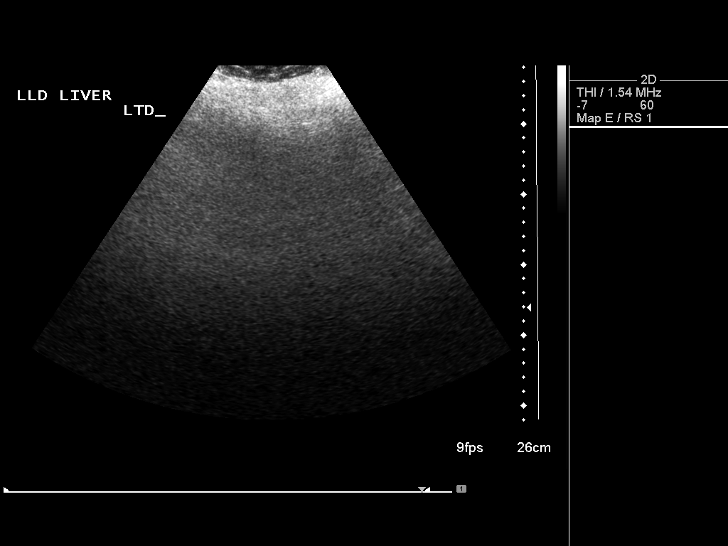
[im 15/35]
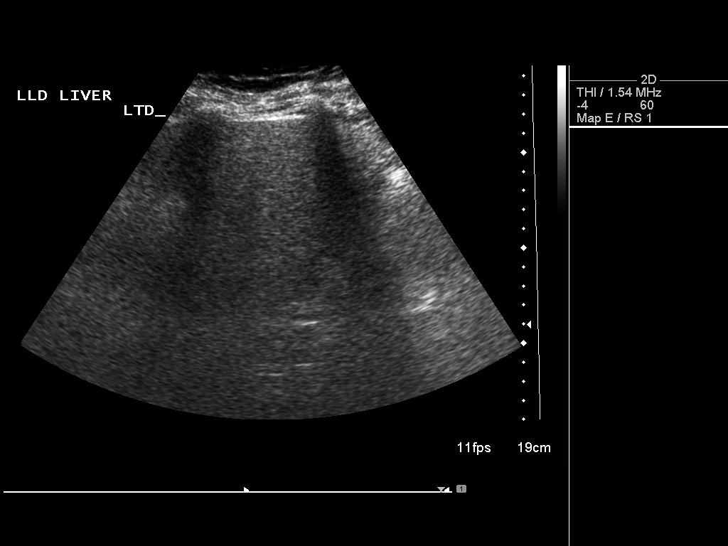
[im 18/35]
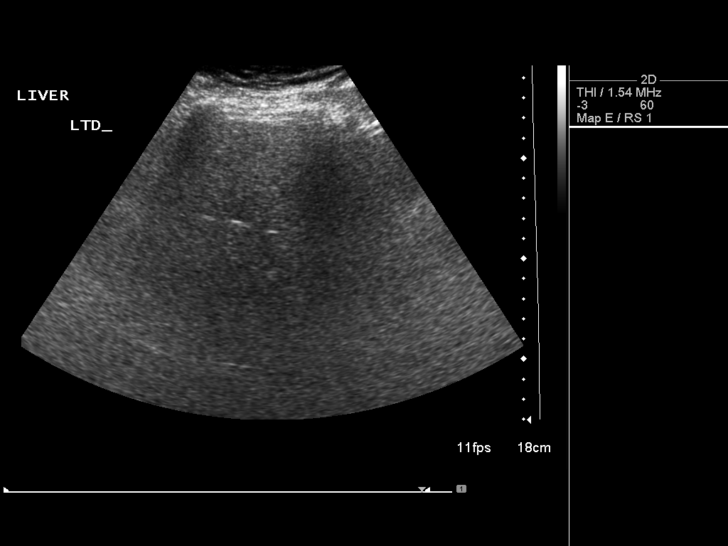
[im 20/35]
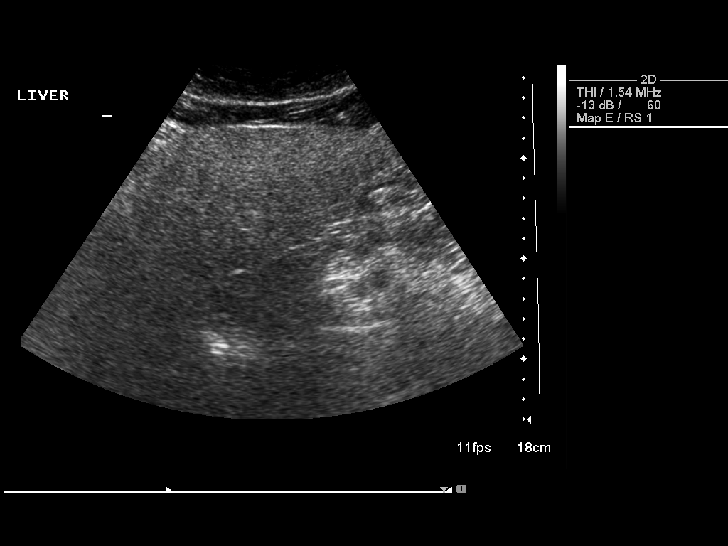
[im 23/35]
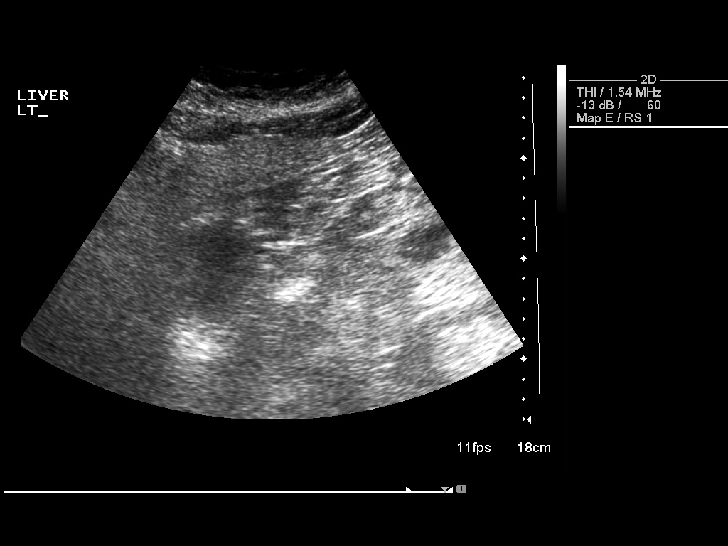
[im 26/35]
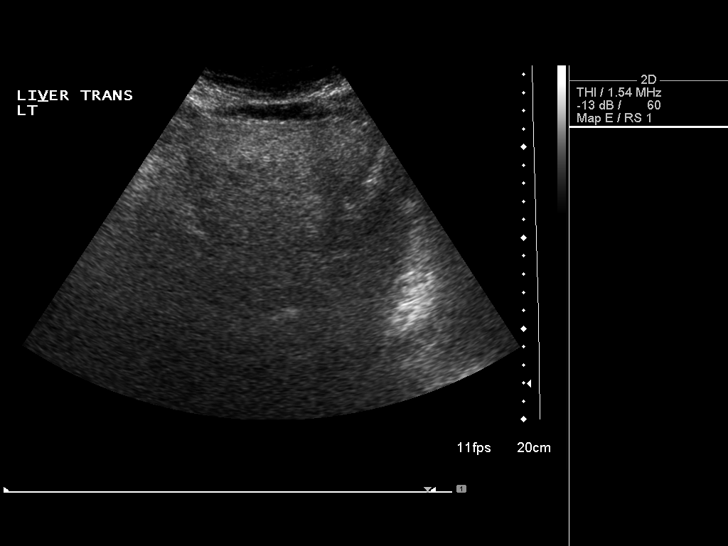
[im 29/35]
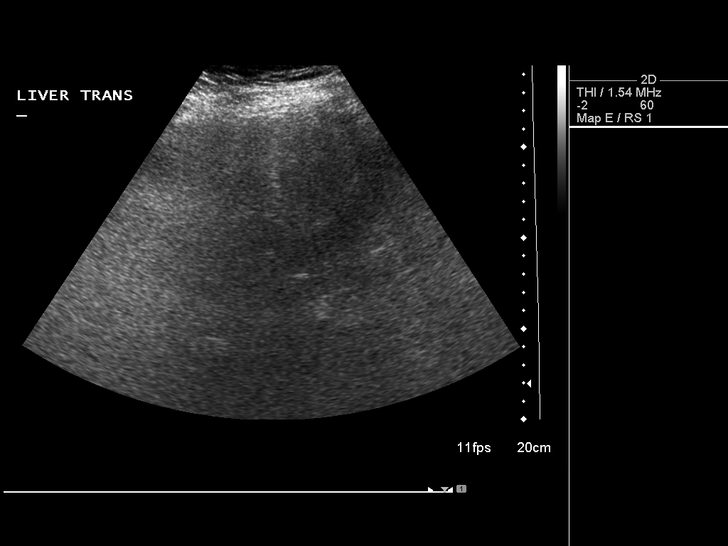
[im 32/35]
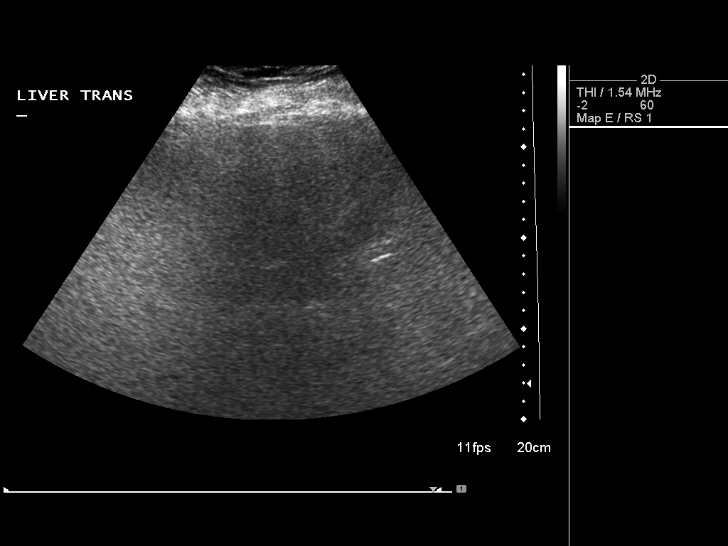
[im 35/35]
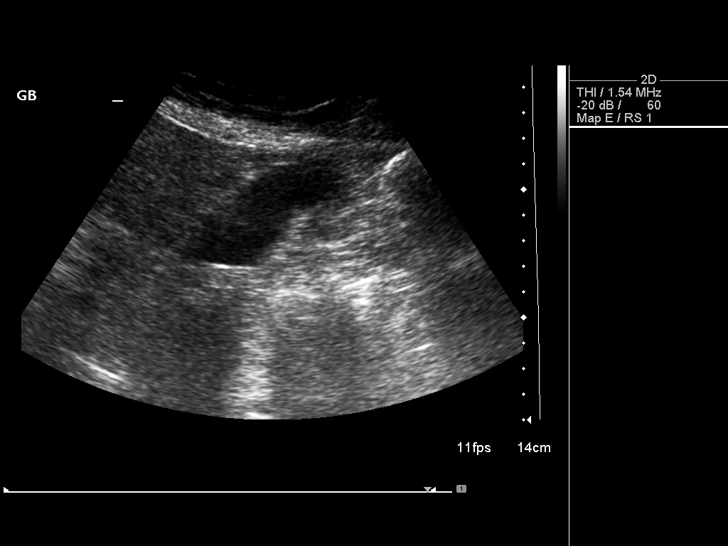

[13 of 25 positions shown; findings below may reference images not displayed]

FINDINGS: Gallbladder:

Multiple dependent calculi in gallbladder up to 10 mm diameter. No
gallbladder wall thickening or pericholecystic fluid. Sonographic
Murphy sign present.

Common bile duct:

Diameter: Normal caliber 4 mm diameter

Liver:

Echogenic, likely fatty infiltration, though this can be seen with
cirrhosis and certain infiltrative disorders. Markedly impaired
visualization of intrahepatic detail due to increased echogenicity
and sound attenuation. Hepatopetal portal venous flow. Unable to
exclude intrahepatic pathology by this exam.

No RIGHT upper quadrant free fluid.
IMPRESSION: Cholelithiasis.

Patient has tenderness over the gallbladder with transducer pressure
but no pericholecystic fluid or gallbladder wall thickening to
suggest acute cholecystitis ; if acute cholecystitis remains a
clinical concern, recommend radionuclide hepatobiliary imaging to
assess.

Probable fatty infiltration of liver with marked impairment of
intrahepatic visualization; unable to exclude intrahepatic pathology
by this exam.

Further hepatic assessment would require CT or MR imaging.

## 2016-11-08 ENCOUNTER — Ambulatory Visit: Payer: Medicare Other | Admitting: Physical Therapy

## 2016-11-08 DIAGNOSIS — Z4789 Encounter for other orthopedic aftercare: Secondary | ICD-10-CM | POA: Diagnosis not present

## 2016-11-15 ENCOUNTER — Ambulatory Visit: Payer: Medicare Other | Attending: Orthopedic Surgery | Admitting: Physical Therapy

## 2016-11-15 DIAGNOSIS — M25612 Stiffness of left shoulder, not elsewhere classified: Secondary | ICD-10-CM | POA: Insufficient documentation

## 2016-11-15 DIAGNOSIS — M25512 Pain in left shoulder: Secondary | ICD-10-CM | POA: Insufficient documentation

## 2016-11-15 DIAGNOSIS — G8929 Other chronic pain: Secondary | ICD-10-CM

## 2016-11-15 NOTE — Therapy (Signed)
[facility] [address] [place], [place], [address] Phone: [phone]   Fax:  [phone]  Physical Therapy Evaluation  Patient Details  Name: Catherine Farrell MRN: [id number] Date of Birth: [birth year] Referring Provider: Francena Hanly MD.  Encounter Date: 11/15/2016      PT End of Session - 11/15/16 1348    PT Start Time 0101   PT Stop Time 0147   PT Time Calculation (min) 46 min   Activity Tolerance Patient tolerated treatment well   Behavior During Therapy [facility] for tasks assessed/performed      Past Medical History:  Diagnosis Date  . Bipolar 1 disorder (HCC)   . Blood transfusion without reported diagnosis   . Cholelithiasis 10/03/2015   GB US 01/11/2014 was NEGATIVE for gallstones, wall thickening or pericholecystic fluid.   . Depression   . Hepatitis C   . Hypertension   . Neuromuscular disorder (HCC)   . PNA (pneumonia) 08/2009   viral  . Restless leg   . Shingles 06/2013   was outside acute window for treatment, started on gabapentin    Past Surgical History:  Procedure Laterality Date  . ABDOMINAL HYSTERECTOMY     partial hysterectomy due to fibroids  . BLADDER SURGERY    . CHOLECYSTECTOMY    . COLON SURGERY    . HERNIA REPAIR    . TUBAL LIGATION      There were no vitals filed for this visit.       Subjective Assessment - 11/15/16 1316    Subjective The patient prsents to OPPT s/p left shoulder scope on 10/30/16.  She states she has been compliant with her HEP including pendulum and attempting to move her left arm overhead while lying down.  Her current pain-level is a 6/10.  Ice and heat decrease her pain.  Movement increases her pain.   Pertinent History Chronic left shoulder pain over the last year.   Patient Stated Goals Use right arm without pain.   Currently in Pain? Yes   Pain Score 6    Pain Location Shoulder   Pain Orientation Right   Pain Descriptors / Indicators Aching;Sharp   Pain Type  Chronic pain   Pain Onset More than a month ago   Pain Frequency Constant   Aggravating Factors  See above.   Pain Relieving Factors See above.            [facility] PT Assessment - 11/15/16 0001      Assessment   Medical Diagnosis Left shoulder scope.   Referring Provider Francena Hanly MD.   Onset Date/Surgical Date --  10/30/16 (surgery date).     Precautions   Precaution Comments Begin with gentle left shoulder range of motion.     Restrictions   Weight Bearing Restrictions No     Balance Screen   Has the patient fallen in the past 6 months No   Has the patient had a decrease in activity level because of a fear of falling?  Yes   Is the patient reluctant to leave their home because of a fear of falling?  No     Prior Function   Level of Independence Independent     Observation/Other Assessments   Observations Left shoulder incisional sites appear to be healing well.     Posture/Postural Control   Posture Comments Guraded.     ROM / Strength   AROM / PROM / Strength PROM     PROM   Overall PROM Comments  PAROM in supine with left shoulder flexion to 105 degrees, ER= 70 degrees and IR to 70 degrees.     Palpation   Palpation comment Tender to palpation over left anterior shoulder region.     Ambulation/Gait   Gait Comments WNL.                   [facility] Adult PT Treatment/Exercise - 11/15/16 0001      Modalities   Modalities Electrical Stimulation;Moist Heat     Moist Heat Therapy   Number Minutes Moist Heat 15 Minutes   Moist Heat Location --  Left shoulder.     Emergency planning/management officerlectrical Stimulation   Electrical Stimulation Location IFC   Electrical Stimulation Action IFC   Electrical Stimulation Parameters 80-150 Hz at 100% scan...non-motoric x 15 minutes.   Electrical Stimulation Goals Pain                  PT Short Term Goals - 11/15/16 1333      PT SHORT TERM GOAL #1   Title Ind with a HEP.   Time 2   Period Weeks   Status New            PT Long Term Goals - 11/15/16 1333      PT LONG TERM GOAL #1   Title Active left shoulder flexion to 155 degrees so the patient can easily reach overhead   Time 4   Period Weeks   Status New     PT LONG TERM GOAL #2   Title Active ER to 80 degrees+ to allow for easily donning/doffing of apparel   Time 4   Period Weeks   Status New     PT LONG TERM GOAL #3   Title Increase ROM so patient is able to reach behind back to L3.   Time 4   Period Weeks   Status New     PT LONG TERM GOAL #4   Title Increase left shoulder strength to a solid 4+/5 to increase stability for performance of functional activities   Time 4   Period Weeks   Status New     PT LONG TERM GOAL #5   Title Perform ADL's with left shoulder pain not > 3/10.   Time 4   Period Weeks   Status New               Plan - 11/15/16 1324    Clinical Impression Statement The patient is s/p lft shoulder scope 10/30/16.  She is compliant to a HEP and was also shoulder active-assistive flexion in supine grasping left wrist with right hand.  She overall is doing well though her flexion is currently limited to 105 degrees.  Current limitations prohibit her from using her left UE to perform ADL's.   Rehab Potential Excellent   PT Frequency 3x / week   PT Duration 4 weeks   PT Treatment/Interventions ADLs/Self Care Home Management;Cryotherapy;Electrical Stimulation;Moist Heat;Ultrasound;Patient/family education;Therapeutic exercise;Therapeutic activities;Manual techniques;Passive range of motion;Vasopneumatic Device   PT Next Visit Plan PROM, PAROM, AAROM to patient's left shoulder.  Progress to pulleys and UE Ranger.  Isometrics then to yellow therband RW4.  Modalities as needed including vasopneumatic.   Consulted and Agree with Plan of Care Patient      Patient will benefit from skilled therapeutic intervention in order to improve the following deficits and impairments:  Pain, Decreased range of motion, Decreased  activity tolerance  Visit Diagnosis: Chronic left shoulder pain - Plan: PT plan  of care cert/re-cert  Stiffness of left shoulder, not elsewhere classified - Plan: PT plan of care cert/re-cert      G-Codes - November 26, 2016 1313    Functional Assessment Tool Used Clinical judgement.   Functional Limitation Carrying, moving and handling objects   Carrying, Moving and Handling Objects Current Status [phone]) At least 40 percent but less than 60 percent impaired, limited or restricted   Carrying, Moving and Handling Objects Goal Status (U0454) At least 1 percent but less than 20 percent impaired, limited or restricted       Problem List Patient Active Problem List   Diagnosis Date Noted  . Adhesive capsulitis of left shoulder 03/20/2016  . CAP (community acquired pneumonia) 11/08/2015  . Acute respiratory failure with hypoxia (HCC) 11/08/2015  . Abnormal thyroid function test 11/02/2015  . Hyperlipidemia 11/02/2015  . History of tobacco use 11/02/2015  . Myalgia and myositis 11/02/2015  . Bipolar 1 disorder (HCC) 09/22/2015  . HTN (hypertension) 09/22/2015  . RLS (restless legs syndrome) 09/22/2015  . Chronic hepatitis C (HCC) 09/22/2015  . sensory trigeminal neuropathy 09/22/2015    [place], [place] MPT 11/26/16, 1:52 PM  [facility] [address] [place], [place], [address] Phone: [phone]   Fax:  [phone]  Name: Catherine Farrell MRN: [id number] Date of Birth: 02-26-1956

## 2016-11-20 ENCOUNTER — Ambulatory Visit: Payer: Medicare Other | Admitting: Physical Therapy

## 2016-11-21 ENCOUNTER — Ambulatory Visit: Payer: Medicare Other | Admitting: Physical Therapy

## 2016-11-21 DIAGNOSIS — M25612 Stiffness of left shoulder, not elsewhere classified: Secondary | ICD-10-CM | POA: Diagnosis not present

## 2016-11-21 DIAGNOSIS — M25512 Pain in left shoulder: Principal | ICD-10-CM

## 2016-11-21 DIAGNOSIS — G8929 Other chronic pain: Secondary | ICD-10-CM | POA: Diagnosis not present

## 2016-11-21 NOTE — Therapy (Signed)
[facility] [address] [place], [place], [address] Phone: [phone]   Fax:  [phone]  Physical Therapy Treatment  Patient Details  Name: Catherine Farrell MRN: [id number] Date of Birth: 10/04/1956 Referring Provider: Francena Hanly, MD   Encounter Date: 11/21/2016      PT End of Session - 11/21/16 1402    Visit Number 2   Number of Visits 12   PT Start Time 1400   PT Stop Time 1441   PT Time Calculation (min) 41 min   Activity Tolerance Patient tolerated treatment well   Behavior During Therapy [facility] for tasks assessed/performed      Past Medical History:  Diagnosis Date  . Bipolar 1 disorder (HCC)   . Blood transfusion without reported diagnosis   . Cholelithiasis 10/03/2015   GB US 01/11/2014 was NEGATIVE for gallstones, wall thickening or pericholecystic fluid.   . Depression   . Hepatitis C   . Hypertension   . Neuromuscular disorder (HCC)   . PNA (pneumonia) 08/2009   viral  . Restless leg   . Shingles 06/2013   was outside acute window for treatment, started on gabapentin    Past Surgical History:  Procedure Laterality Date  . ABDOMINAL HYSTERECTOMY     partial hysterectomy due to fibroids  . BLADDER SURGERY    . CHOLECYSTECTOMY    . COLON SURGERY    . HERNIA REPAIR    . TUBAL LIGATION      There were no vitals filed for this visit.      Subjective Assessment - 11/21/16 1407    Subjective Pt. reporting L shoulder pain has been worse the last few days without known trigger.  Pt. reporting consistent adherence to initial HEP.     Patient Stated Goals Use right arm without pain.   Currently in Pain? Yes   Pain Score 6    Pain Location Shoulder   Pain Orientation Right   Pain Descriptors / Indicators Aching;Sharp   Pain Type Chronic pain   Pain Onset More than a month ago   Pain Frequency Constant   Aggravating Factors  raising arm above head and behind back   Multiple Pain Sites No             [facility] PT Assessment - 11/21/16 1439      Assessment   Medical Diagnosis Left shoulder scope.   Referring Provider Francena Hanly, MD    Next MD Visit 3.1.18              [facility] Adult PT Treatment/Exercise - 11/21/16 1418      Exercises   Exercises Shoulder     Shoulder Exercises: Supine   External Rotation AROM;Left;15 reps   External Rotation Limitations Wand; towel pinch as cue; to tolerance    Flexion AROM;15 reps   Flexion Limitations wand overhead; to tolerance    ABduction AROM;15 reps;Left   ABduction Limitations With wand; to tolerance     Shoulder Exercises: Pulleys   Flexion 2 minutes   Flexion Limitations cues required for L shoulder relaxation   ABduction 2 minutes   ABduction Limitations cues required for L shoulder relaxation     Shoulder Exercises: Isometric Strengthening   Flexion 5X5"   Flexion Limitations standing into wall; no increased pain  L shoulder in neutral    Extension 5X5"   Extension Limitations Standing neutral into wall; no increased pain   External Rotation 5X5"  L shoulder in neutral    External  Rotation Limitations supine with therapist resistance; no increased pain   Internal Rotation 5X5"  L shoulder in neutral   Internal Rotation Limitations Supine with therapist; no increased pain    ADduction 5X5"  L shoulder in neutral    ADduction Limitations Standing pushing into wall            PT Short Term Goals - 11/15/16 1333      PT SHORT TERM GOAL #1   Title Ind with a HEP.   Time 2   Period Weeks   Status New           PT Long Term Goals - 11/15/16 1333      PT LONG TERM GOAL #1   Title Active left shoulder flexion to 155 degrees so the patient can easily reach overhead   Time 4   Period Weeks   Status New     PT LONG TERM GOAL #2   Title Active ER to 80 degrees+ to allow for easily donning/doffing of apparel   Time 4   Period Weeks   Status New     PT LONG TERM GOAL #3   Title Increase ROM so patient is  able to reach behind back to L3.   Time 4   Period Weeks   Status New     PT LONG TERM GOAL #4   Title Increase left shoulder strength to a solid 4+/5 to increase stability for performance of functional activities   Time 4   Period Weeks   Status New     PT LONG TERM GOAL #5   Title Perform ADL's with left shoulder pain not > 3/10.   Time 4   Period Weeks   Status New               Plan - 11/21/16 1406    Clinical Impression Statement Pt. reporting L shoulder somewhat worse today without known trigger however, she has been performing HEP consistently.  AAROM with wand, and isometrics initiated without pain increase today with pt. reporting a decrease in pain ending therex.  Modalities for pain relief deferred to low pain levels to conclude treatment.  Pt. will continue to benefit from further skilled therapy to restore L shoulder ROM, strength, and function.    PT Treatment/Interventions ADLs/Self Care Home Management;Cryotherapy;Electrical Stimulation;Moist Heat;Ultrasound;Patient/family education;Therapeutic exercise;Therapeutic activities;Manual techniques;Passive range of motion;Vasopneumatic Device   PT Next Visit Plan PROM, PAROM, AAROM to patient's left shoulder.  Progress to pulleys and UE Ranger.  Isometrics then to yellow therband RW4.  Modalities as needed including vasopneumatic      Patient will benefit from skilled therapeutic intervention in order to improve the following deficits and impairments:  Pain, Decreased range of motion, Decreased activity tolerance  Visit Diagnosis: Chronic left shoulder pain  Stiffness of left shoulder, not elsewhere classified     Problem List Patient Active Problem List   Diagnosis Date Noted  . Adhesive capsulitis of left shoulder 03/20/2016  . CAP (community acquired pneumonia) 11/08/2015  . Acute respiratory failure with hypoxia (HCC) 11/08/2015  . Abnormal thyroid function test 11/02/2015  . Hyperlipidemia 11/02/2015   . History of tobacco use 11/02/2015  . Myalgia and myositis 11/02/2015  . Bipolar 1 disorder (HCC) 09/22/2015  . HTN (hypertension) 09/22/2015  . RLS (restless legs syndrome) 09/22/2015  . Chronic hepatitis C (HCC) 09/22/2015  . sensory trigeminal neuropathy 09/22/2015    Kermit Balo, PTA 11/21/16 4:00 PM  [facility] [address] [place], [place], [address] Phone: [phone]   Fax:  [phone]  Name: Catherine Farrell MRN: [id number] Date of Birth: 08/03/1956

## 2016-11-22 ENCOUNTER — Encounter: Payer: Medicare Other | Admitting: Physical Therapy

## 2016-11-27 ENCOUNTER — Ambulatory Visit: Payer: Medicare Other | Admitting: Physical Therapy

## 2016-11-27 DIAGNOSIS — M25612 Stiffness of left shoulder, not elsewhere classified: Secondary | ICD-10-CM

## 2016-11-27 DIAGNOSIS — G8929 Other chronic pain: Secondary | ICD-10-CM

## 2016-11-27 DIAGNOSIS — M25512 Pain in left shoulder: Secondary | ICD-10-CM | POA: Diagnosis not present

## 2016-11-27 NOTE — Therapy (Signed)
[facility] [address] [place], [place], [address] Phone: [phone]   Fax:  [phone]  Physical Therapy Treatment  Patient Details  Name: Catherine StallionCarolyn Fleeger MRN: [id number] Date of Birth: 04/12/1956 Referring Provider: Francena HanlyKevin Supple, MD   Encounter Date: 11/27/2016      PT End of Session - 11/27/16 1257    Visit Number 3   Number of Visits 12   PT Start Time 1300   PT Stop Time 1400   PT Time Calculation (min) 60 min   Activity Tolerance Patient tolerated treatment well   Behavior [facility] Therapy [facility] for tasks assessed/performed      Past Medical History:  Diagnosis Date  . Bipolar 1 disorder (HCC)   . Blood transfusion without reported diagnosis   . Cholelithiasis 10/03/2015   GB US 01/11/2014 was NEGATIVE for gallstones, wall thickening or pericholecystic fluid.   . Depression   . Hepatitis C   . Hypertension   . Neuromuscular disorder (HCC)   . PNA (pneumonia) 08/2009   viral  . Restless leg   . Shingles 06/2013   was outside acute window for treatment, started on gabapentin    Past Surgical History:  Procedure Laterality Date  . ABDOMINAL HYSTERECTOMY     partial hysterectomy due to fibroids  . BLADDER SURGERY    . CHOLECYSTECTOMY    . COLON SURGERY    . HERNIA REPAIR    . TUBAL LIGATION      There were no vitals filed for this visit.      Subjective Assessment - 11/27/16 1301    Subjective Patient states her left shoulder pain is better than at last visit.   Pertinent History Chronic left shoulder pain over the last year.   Patient Stated Goals Use right arm without pain.   Currently in Pain? Yes   Pain Score 4    Pain Location Shoulder   Pain Orientation Right   Pain Descriptors / Indicators Aching;Sharp   Pain Type Chronic pain   Pain Onset More than a month ago   Pain Frequency Constant   Aggravating Factors  raising arm overhead and behind back            [facility] Assessment - 11/27/16  0001      PROM   Overall PROM Comments flex 130 deg active                     [facility] Adult PT Treatment/Exercise - 11/27/16 0001      Shoulder Exercises: Pulleys   Flexion Other (comment)  4 min   ABduction Other (comment)  with external rotation x 4 min   Other Pulley Exercises Wall ladder flexion x 5     Shoulder Exercises: ROM/Strengthening   Other ROM/Strengthening Exercises cane: flex x 20; ext and IR (standing) x 10     Shoulder Exercises: Isometric Strengthening   Flexion 5X10"   Extension 5X10"   External Rotation 5X10"   Internal Rotation 5X10"   ADduction 5X10"     Modalities   Modalities Electrical Stimulation;Cryotherapy     Cryotherapy   Number Minutes Cryotherapy 15 Minutes   Cryotherapy Location Shoulder   Type of Cryotherapy Ice pack     Electrical Stimulation   Electrical Stimulation Location L shoulder   Electrical Stimulation Action IFC    Electrical Stimulation Parameters 80-150Hz  x 15 min   Electrical Stimulation Goals Pain     Manual Therapy   Manual Therapy Passive  ROM   Passive ROM to L shoulder flex/IR/ER with prolonged holds                PT Education - 11/27/16 1338    Education provided Yes   Education Details HEP   Person(s) Educated Patient   Methods Demonstration;Explanation;Handout   Comprehension Verbalized understanding;Returned demonstration          PT Short Term Goals - 11/27/16 1339      PT SHORT TERM GOAL #1   Title Ind with a HEP.   Time 2   Period Weeks   Status Achieved           PT Long Term Goals - 11/27/16 1353      PT LONG TERM GOAL #1   Title Active left shoulder flexion to 155 degrees so the patient can easily reach overhead   Period Weeks   Status On-going     PT LONG TERM GOAL #2   Title Active ER to 80 degrees+ to allow for easily donning/doffing of apparel   Time 4   Period Weeks   Status On-going     PT LONG TERM GOAL #3   Title Increase ROM so patient is able  to reach behind back to L3.   Baseline only to L5 as of 04/11/16   Time 4   Period Weeks   Status On-going     PT LONG TERM GOAL #4   Title Increase left shoulder strength to a solid 4+/5 to increase stability for performance of functional activities   Time 4   Period Weeks   Status On-going     PT LONG TERM GOAL #5   Title Perform ADL's with left shoulder pain not > 3/10.   Time 4   Period Weeks   Status On-going               Plan - 11/27/16 1315    Clinical Impression Statement Patient is doing very well with ROM with flexion increasing by 25 degrees since eval. She reports using yellow tband at home for exercises which had been assigned prior to surgery. PT advised holding Tband and do isometrics for a week or so to manage pain better. Remaining LTGs are ongoing. Normal response to estim today.   Rehab Potential Excellent   PT Frequency 3x / week   PT Duration 4 weeks   PT Treatment/Interventions ADLs/Self Care Home Management;Cryotherapy;Electrical Stimulation;Moist Heat;Ultrasound;Patient/family education;Therapeutic exercise;Therapeutic activities;Manual techniques;Passive range of motion;Vasopneumatic Device   PT Next Visit Plan RTD 12/06/16. PROM, PAROM, AAROM to patient's left shoulder.  Progress to pulleys and UE Ranger.  Isometrics then to yellow therband RW4.  Modalities as needed including vasopneumatic   PT Home Exercise Plan isometrics, cane: flex, ext and IR.   Consulted and Agree with Plan of Care Patient      Patient will benefit from skilled therapeutic intervention in order to improve the following deficits and impairments:     Visit Diagnosis: Stiffness of left shoulder, not elsewhere classified  Chronic left shoulder pain     Problem List Patient Active Problem List   Diagnosis Date Noted  . Adhesive capsulitis of left shoulder 03/20/2016  . CAP (community acquired pneumonia) 11/08/2015  . Acute respiratory failure with hypoxia (HCC) 11/08/2015   . Abnormal thyroid function test 11/02/2015  . Hyperlipidemia 11/02/2015  . History of tobacco use 11/02/2015  . Myalgia and myositis 11/02/2015  . Bipolar 1 disorder (HCC) 09/22/2015  . HTN (hypertension) 09/22/2015  .  RLS (restless legs syndrome) 09/22/2015  . Chronic hepatitis C (HCC) 09/22/2015  . sensory trigeminal neuropathy 09/22/2015    Solon Palm PT 11/27/2016, 1:58 PM  [facility] [address] [place], [place], [address] Phone: [phone]   Fax:  [phone]  Name: Kalyn Hofstra MRN: [id number] Date of Birth: [birth year]

## 2016-11-27 NOTE — Patient Instructions (Addendum)
Strengthening: Isometric Flexion  Using wall for resistance, press right fist into ball using light pressure. Hold __10__ seconds. Repeat __5_ times per set. Do ____ sets per session. Do _2___ sessions per day.  SHOULDER: Abduction (Isometric)  Use wall as resistance. Press arm against pillow. Keep elbow straight. Hold _10_ seconds. 5___ reps per set, 2___ sets per day, ___ days per week  Extension (Isometric)  Place left bent elbow and back of arm against wall. Press elbow against wall. Hold __10__ seconds. Repeat _5___ times. Do __2__ sessions per day.  Internal Rotation (Isometric)  Place palm of right fist against door frame, with elbow bent. Press fist against door frame. Hold _10___ seconds. Repeat __5__ times. Do __2__ sessions per day.  External Rotation (Isometric)  Place back of left fist against door frame, with elbow bent. Press fist against door frame. Hold __10__ seconds. Repeat _5___ times. Do ___2_ sessions per day.   Cane Exercise: Extension / Internal Rotation   Stand holding cane behind back with both hands palm-up. Slide cane up spine toward head. Hold __2-3__ seconds. Repeat 20-30____ times. Do _2___ sessions per day.  http://gt2.exer.us/85   Copyright  VHI. All rights reserved.  Catherine Farrell.  Cane Exercise: Extension   Stand holding cane behind back with both hands palm-up. Lift the cane away from body. Hold _2-3___ seconds. Repeat ___20-30_ times. Do _2___ sessions per day.  Catherine Farrell, PT 11/27/16 1:14 PM [facility] [address] [place], [place], [address] Phone: [phone]   Fax:  [phone]

## 2016-11-29 ENCOUNTER — Encounter: Payer: Medicare Other | Admitting: *Deleted

## 2016-12-04 ENCOUNTER — Ambulatory Visit: Payer: Medicare Other | Admitting: Physical Therapy

## 2016-12-04 ENCOUNTER — Encounter: Payer: Self-pay | Admitting: Physical Therapy

## 2016-12-04 DIAGNOSIS — G8929 Other chronic pain: Secondary | ICD-10-CM

## 2016-12-04 DIAGNOSIS — M25512 Pain in left shoulder: Secondary | ICD-10-CM | POA: Diagnosis not present

## 2016-12-04 DIAGNOSIS — M25612 Stiffness of left shoulder, not elsewhere classified: Secondary | ICD-10-CM | POA: Diagnosis not present

## 2016-12-04 NOTE — Telephone Encounter (Signed)
close

## 2016-12-04 NOTE — Therapy (Signed)
[facility] [place], [place], [address] Phone: [phone]   Fax:  [phone]  Physical Therapy Treatment  Patient Details  Name: Catherine Farrell MRN: [id number] Date of Birth: [birth year] Referring Provider: Justice Britain, MD   Encounter Date: 12/04/2016      PT End of Session - 12/04/16 1357    Visit Number 4   Number of Visits 12   Date for PT Re-Evaluation 01/13/17   PT Start Time 1300   PT Stop Time 1345   PT Time Calculation (min) 45 min   Activity Tolerance Patient tolerated treatment well   Behavior During Therapy [facility] for tasks assessed/performed      Past Medical History:  Diagnosis Date  . Bipolar 1 disorder ([place])   . Blood transfusion without reported diagnosis   . Cholelithiasis 10/03/2015   GB US 01/11/2014 was NEGATIVE for gallstones, wall thickening or pericholecystic fluid.   . Depression   . Hepatitis C   . Hypertension   . Neuromuscular disorder ([place])   . PNA (pneumonia) 08/2009   viral  . Restless leg   . Shingles 06/2013   was outside acute window for treatment, started on gabapentin    Past Surgical History:  Procedure Laterality Date  . ABDOMINAL HYSTERECTOMY     partial hysterectomy due to fibroids  . BLADDER SURGERY    . CHOLECYSTECTOMY    . COLON SURGERY    . HERNIA REPAIR    . TUBAL LIGATION      There were no vitals filed for this visit.          [facility] PT Assessment - 12/04/16 0001      PROM   Overall PROM Comments Left Shoulder Flexion: 135 degrees                      [facility] Adult PT Treatment/Exercise - 12/04/16 0001      Shoulder Exercises: Supine   External Rotation AAROM;15 reps   External Rotation Limitations wand and towel assistance   Flexion AAROM;20 reps;Limitations   Flexion Limitations wand overhead to tolerance   ABduction AAROM;15 reps   ABduction Limitations wand to tolerance     Shoulder Exercises: Standing   Row 10 reps;Theraband   2 sets   Theraband Level (Shoulder Row) Level 2 (Red)   Other Standing Exercises Ranger, circles clockwise and counter clockwise 15 reps each direction     Shoulder Exercises: Isometric Strengthening   Flexion 5X10"   Extension 5X10"   External Rotation 5X10"   Internal Rotation 5X10"                PT Education - 12/04/16 1356    Education provided Yes   Education Details Reviewed HEP   Methods Explanation;Demonstration   Comprehension Verbalized understanding;Returned demonstration          PT Short Term Goals - 12/04/16 1403      PT SHORT TERM GOAL #1   Title Ind with a HEP.   Time 2   Period Weeks   Status Achieved           PT Long Term Goals - 11/27/16 1353      PT LONG TERM GOAL #1   Title Active left shoulder flexion to 155 degrees so the patient can easily reach overhead   Period Weeks   Status On-going     PT LONG TERM GOAL #2   Title Active ER to 80 degrees+ to allow  for easily donning/doffing of apparel   Time 4   Period Weeks   Status On-going     PT LONG TERM GOAL #3   Title Increase ROM so patient is able to reach behind back to L3.   Baseline only to L5 as of 04/11/16   Time 4   Period Weeks   Status On-going     PT LONG TERM GOAL #4   Title Increase left shoulder strength to a solid 4+/5 to increase stability for performance of functional activities   Time 4   Period Weeks   Status On-going     PT LONG TERM GOAL #5   Title Perform ADL's with left shoulder pain not > 3/10.   Time 4   Period Weeks   Status On-going               Plan - 12/04/16 1357    Clinical Impression Statement Pt tolerating treatment well with ROM and strengthening exercises. Pt reporting less pain at end of session. Skilled PT to continue to progress pt toward her LTG's set. No goals met this treatment session.    Rehab Potential Excellent   PT Frequency 3x / week   PT Duration 4 weeks   PT Treatment/Interventions ADLs/Self Care Home  Management;Cryotherapy;Electrical Stimulation;Moist Heat;Ultrasound;Patient/family education;Therapeutic exercise;Therapeutic activities;Manual techniques;Passive range of motion;Vasopneumatic Device   PT Next Visit Plan RTD 12/06/16. PROM, PAROM, AAROM to patient's left shoulder.  Progress to pulleys and UE Ranger.   Modalities as needed.    PT Home Exercise Plan isometrics, cane: flex, ext and IR.   Consulted and Agree with Plan of Care Patient      Patient will benefit from skilled therapeutic intervention in order to improve the following deficits and impairments:  Pain, Decreased range of motion, Decreased activity tolerance, Impaired UE functional use, Postural dysfunction  Visit Diagnosis: Stiffness of left shoulder, not elsewhere classified  Chronic left shoulder pain  Acute pain of left shoulder     Problem List Patient Active Problem List   Diagnosis Date Noted  . Adhesive capsulitis of left shoulder 03/20/2016  . CAP (community acquired pneumonia) 11/08/2015  . Acute respiratory failure with hypoxia ([place]) 11/08/2015  . Abnormal thyroid function test 11/02/2015  . Hyperlipidemia 11/02/2015  . History of tobacco use 11/02/2015  . Myalgia and myositis 11/02/2015  . Bipolar 1 disorder ([place]) 09/22/2015  . HTN (hypertension) 09/22/2015  . RLS (restless legs syndrome) 09/22/2015  . Chronic hepatitis C ([place]) 09/22/2015  . sensory trigeminal neuropathy 09/22/2015    Oretha Caprice, MPT 12/04/2016, 2:06 PM  [facility] [place], [place], [address] Phone: [phone]   Fax:  [phone]  Name: Inna Tisdell MRN: [id number] Date of Birth: 03-Nov-1955

## 2016-12-06 ENCOUNTER — Ambulatory Visit: Payer: Medicare Other | Attending: Orthopedic Surgery | Admitting: Physical Therapy

## 2016-12-06 ENCOUNTER — Encounter: Payer: Self-pay | Admitting: Physical Therapy

## 2016-12-06 DIAGNOSIS — G8929 Other chronic pain: Secondary | ICD-10-CM | POA: Diagnosis not present

## 2016-12-06 DIAGNOSIS — M25612 Stiffness of left shoulder, not elsewhere classified: Secondary | ICD-10-CM | POA: Diagnosis not present

## 2016-12-06 DIAGNOSIS — M25512 Pain in left shoulder: Secondary | ICD-10-CM | POA: Insufficient documentation

## 2016-12-06 NOTE — Therapy (Signed)
[facility] [address] [place], [place], [address] Phone: [phone]   Fax:  [phone]  Physical Therapy Treatment  Patient Details  Name: Catherine Farrell MRN: [id number] Date of Birth: 06/20/1956 Referring Provider: Francena HanlyKevin Supple, MD   Encounter Date: 12/06/2016      PT End of Session - 12/06/16 1506    Visit Number 5   Number of Visits 12   Date for PT Re-Evaluation 01/13/17   PT Start Time 1443   PT Stop Time 1507   PT Time Calculation (min) 24 min   Activity Tolerance Patient tolerated treatment well   Behavior During Therapy [facility] for tasks assessed/performed      Past Medical History:  Diagnosis Date  . Bipolar 1 disorder ([facility])   . Blood transfusion without reported diagnosis   . Cholelithiasis 10/03/2015   GB US 01/11/2014 was NEGATIVE for gallstones, wall thickening or pericholecystic fluid.   . Depression   . Hepatitis C   . Hypertension   . Neuromuscular disorder ([facility])   . PNA (pneumonia) 08/2009   viral  . Restless leg   . Shingles 06/2013   was outside acute window for treatment, started on gabapentin    Past Surgical History:  Procedure Laterality Date  . ABDOMINAL HYSTERECTOMY     partial hysterectomy due to fibroids  . BLADDER SURGERY    . CHOLECYSTECTOMY    . COLON SURGERY    . HERNIA REPAIR    . TUBAL LIGATION      There were no vitals filed for this visit.      Subjective Assessment - 12/06/16 1448    Subjective Patient arrived with minimal pain complaints   Pertinent History Chronic left shoulder pain over the last year.   Patient Stated Goals Use right arm without pain.   Currently in Pain? Yes   Pain Score 3    Pain Location Shoulder   Pain Orientation Right   Pain Descriptors / Indicators Aching   Pain Type Chronic pain   Pain Onset More than a month ago   Pain Frequency Constant   Aggravating Factors  certain movements   Pain Relieving Factors at rest            [facility] PT  Assessment - 12/06/16 0001      ROM / Strength   AROM / PROM / Strength AROM;PROM     AROM   AROM Assessment Site Shoulder   Right/Left Shoulder Left   Left Shoulder Flexion 120 Degrees   Left Shoulder External Rotation 70 Degrees     PROM   PROM Assessment Site Shoulder   Right/Left Shoulder Left   Left Shoulder External Rotation 75 Degrees                     [facility] Adult PT Treatment/Exercise - 12/06/16 0001      Shoulder Exercises: Standing   Protraction Strengthening;Left;20 reps;Theraband  half range   Theraband Level (Shoulder Protraction) Level 1 (Yellow)   External Rotation Strengthening;Left;20 reps;Theraband  half range   Theraband Level (Shoulder External Rotation) Level 1 (Yellow)   Internal Rotation Strengthening;Left;20 reps;Theraband  half range   Theraband Level (Shoulder Internal Rotation) Level 1 (Yellow)   Row Strengthening;Left;20 reps;Theraband  half range   Theraband Level (Shoulder Row) Level 1 (Yellow)     Shoulder Exercises: Pulleys   Flexion Other (comment)  4min     Cryotherapy   Number Minutes Cryotherapy --   Cryotherapy Location --  Type of Cryotherapy --     Librarian, academic Parameters --   Electrical Stimulation Goals --     Manual Therapy   Manual Therapy Passive ROM   Passive ROM gentle PROM into ER, ended treatment early due to illness                  PT Short Term Goals - 12/04/16 1403      PT SHORT TERM GOAL #1   Title Ind with a HEP.   Time 2   Period Weeks   Status Achieved           PT Long Term Goals - 11/27/16 1353      PT LONG TERM GOAL #1   Title Active left shoulder flexion to 155 degrees so the patient can easily reach overhead   Period Weeks   Status On-going     PT LONG TERM GOAL #2   Title Active ER to 80 degrees+ to allow for easily donning/doffing of apparel   Time  4   Period Weeks   Status On-going     PT LONG TERM GOAL #3   Title Increase ROM so patient is able to reach behind back to L3.   Baseline only to L5 as of 04/11/16   Time 4   Period Weeks   Status On-going     PT LONG TERM GOAL #4   Title Increase left shoulder strength to a solid 4+/5 to increase stability for performance of functional activities   Time 4   Period Weeks   Status On-going     PT LONG TERM GOAL #5   Title Perform ADL's with left shoulder pain not > 3/10.   Time 4   Period Weeks   Status On-going               Plan - 12/06/16 1507    Clinical Impression Statement Patient tolerated treatment well with no reported pain. Patient had some tightness with manual ER today. Unable to complete treatment due to illness per patient. Patient current goals ongoing due to ROM, strength and pain deficts.   Rehab Potential Excellent   PT Frequency 3x / week   PT Duration 4 weeks   PT Treatment/Interventions ADLs/Self Care Home Management;Cryotherapy;Electrical Stimulation;Moist Heat;Ultrasound;Patient/family education;Therapeutic exercise;Therapeutic activities;Manual techniques;Passive range of motion;Vasopneumatic Device   PT Next Visit Plan cont with POC per MPT   Consulted and Agree with Plan of Care Patient      Patient will benefit from skilled therapeutic intervention in order to improve the following deficits and impairments:  Pain, Decreased range of motion, Decreased activity tolerance, Impaired UE functional use, Postural dysfunction  Visit Diagnosis: Stiffness of left shoulder, not elsewhere classified  Chronic left shoulder pain  Acute pain of left shoulder     Problem List Patient Active Problem List   Diagnosis Date Noted  . Adhesive capsulitis of left shoulder 03/20/2016  . CAP (community acquired pneumonia) 11/08/2015  . Acute respiratory failure with hypoxia ([facility]) 11/08/2015  . Abnormal thyroid function test 11/02/2015  . Hyperlipidemia  11/02/2015  . History of tobacco use 11/02/2015  . Myalgia and myositis 11/02/2015  . Bipolar 1 disorder ([facility]) 09/22/2015  . HTN (hypertension) 09/22/2015  . RLS (restless legs syndrome) 09/22/2015  . Chronic hepatitis C ([facility]) 09/22/2015  . sensory trigeminal neuropathy 09/22/2015    Tradarius Reinwald P , PTA 12/06/2016, 3:11  PM  [facility] [address] [place], [place], [address] Phone: [phone]   Fax:  [phone]  Name: Catherine Farrell MRN: [id number] Date of Birth: 1956-05-19

## 2016-12-11 ENCOUNTER — Ambulatory Visit: Payer: Medicare Other | Admitting: *Deleted

## 2016-12-11 DIAGNOSIS — M25512 Pain in left shoulder: Secondary | ICD-10-CM

## 2016-12-11 DIAGNOSIS — G8929 Other chronic pain: Secondary | ICD-10-CM

## 2016-12-11 DIAGNOSIS — M25612 Stiffness of left shoulder, not elsewhere classified: Secondary | ICD-10-CM | POA: Diagnosis not present

## 2016-12-11 NOTE — Therapy (Signed)
[facility] [address] [place], [place], [address] Phone: [phone]   Fax:  [phone]  Physical Therapy Treatment  Patient Details  Name: Catherine Farrell MRN: [id number] Date of Birth: Dec 03, 1955 Referring Provider: Francena Hanly, MD   Encounter Date: 12/11/2016      PT End of Session - 12/11/16 1616    Visit Number 6   Number of Visits 12   Date for PT Re-Evaluation 01/13/17   PT Start Time 1300   PT Stop Time 1350   PT Time Calculation (min) 50 min      Past Medical History:  Diagnosis Date  . Bipolar 1 disorder ([facility])   . Blood transfusion without reported diagnosis   . Cholelithiasis 10/03/2015   GB US 01/11/2014 was NEGATIVE for gallstones, wall thickening or pericholecystic fluid.   . Depression   . Hepatitis C   . Hypertension   . Neuromuscular disorder ([facility])   . PNA (pneumonia) 08/2009   viral  . Restless leg   . Shingles 06/2013   was outside acute window for treatment, started on gabapentin    Past Surgical History:  Procedure Laterality Date  . ABDOMINAL HYSTERECTOMY     partial hysterectomy due to fibroids  . BLADDER SURGERY    . CHOLECYSTECTOMY    . COLON SURGERY    . HERNIA REPAIR    . TUBAL LIGATION      There were no vitals filed for this visit.      Subjective Assessment - 12/11/16 1302    Subjective Patient arrived with minimal pain complaints   Pertinent History Chronic left shoulder pain over the last year.   Patient Stated Goals Use right arm without pain.   Currently in Pain? Yes   Pain Score 3    Pain Location Shoulder   Pain Orientation Right   Pain Descriptors / Indicators Aching   Pain Type Chronic pain   Pain Onset More than a month ago   Pain Frequency Constant                         [facility] Adult PT Treatment/Exercise - 12/11/16 0001      Shoulder Exercises: Standing   Protraction Strengthening;Left;20 reps;Theraband   Theraband Level (Shoulder  Protraction) Level 1 (Yellow)   External Rotation Strengthening;Left;20 reps;Theraband  half range   Theraband Level (Shoulder External Rotation) Level 1 (Yellow)   Internal Rotation Strengthening;Left;20 reps;Theraband  half range   Theraband Level (Shoulder Internal Rotation) Level 1 (Yellow)   Row Strengthening;Left;20 reps;Theraband  half range   Theraband Level (Shoulder Row) Level 1 (Yellow)     Shoulder Exercises: Pulleys   Flexion Other (comment)    Other Pulley Exercises standing UE flexion and circles     Cryotherapy   Number Minutes Cryotherapy 15 Minutes   Cryotherapy Location Shoulder   Type of Cryotherapy Ice pack     Electrical Stimulation   Electrical Stimulation Location L shoulder  IFC 80-150hz  x 15 mins   Electrical Stimulation Goals Pain     Manual Therapy   Manual Therapy Passive ROM   Passive ROM gentle PROM into ER, ended treatment early due to illness                  PT Short Term Goals - 12/04/16 1403      PT SHORT TERM GOAL #1   Title Ind with a HEP.   Time 2   Period Weeks  Status Achieved           PT Long Term Goals - 11/27/16 1353      PT LONG TERM GOAL #1   Title Active left shoulder flexion to 155 degrees so the patient can easily reach overhead   Period Weeks   Status On-going     PT LONG TERM GOAL #2   Title Active ER to 80 degrees+ to allow for easily donning/doffing of apparel   Time 4   Period Weeks   Status On-going     PT LONG TERM GOAL #3   Title Increase ROM so patient is able to reach behind back to L3.   Baseline only to L5 as of 04/11/16   Time 4   Period Weeks   Status On-going     PT LONG TERM GOAL #4   Title Increase left shoulder strength to a solid 4+/5 to increase stability for performance of functional activities   Time 4   Period Weeks   Status On-going     PT LONG TERM GOAL #5   Title Perform ADL's with left shoulder pain not > 3/10.   Time 4   Period Weeks   Status On-going                Plan - 12/11/16 1609    Clinical Impression Statement Pt arrived to clinic today with only mild pain in LT shldr and was able to complete AAROM and strengthening Exs with minimal increase in pain. She also did fairly well with PROM, but took  verbal cues to get Pt to relax and perform stretching. LTGs are ongoing.   Rehab Potential Excellent   PT Frequency 3x / week   PT Duration 4 weeks   PT Treatment/Interventions ADLs/Self Care Home Management;Cryotherapy;Electrical Stimulation;Moist Heat;Ultrasound;Patient/family education;Therapeutic exercise;Therapeutic activities;Manual techniques;Passive range of motion;Vasopneumatic Device   PT Next Visit Plan cont with POC per MPT   PT Home Exercise Plan isometrics, cane: flex, ext and IR.   Consulted and Agree with Plan of Care Patient      Patient will benefit from skilled therapeutic intervention in order to improve the following deficits and impairments:  Pain, Decreased range of motion, Decreased activity tolerance, Impaired UE functional use, Postural dysfunction  Visit Diagnosis: Stiffness of left shoulder, not elsewhere classified  Chronic left shoulder pain  Acute pain of left shoulder     Problem List Patient Active Problem List   Diagnosis Date Noted  . Adhesive capsulitis of left shoulder 03/20/2016  . CAP (community acquired pneumonia) 11/08/2015  . Acute respiratory failure with hypoxia ([facility]) 11/08/2015  . Abnormal thyroid function test 11/02/2015  . Hyperlipidemia 11/02/2015  . History of tobacco use 11/02/2015  . Myalgia and myositis 11/02/2015  . Bipolar 1 disorder ([facility]) 09/22/2015  . HTN (hypertension) 09/22/2015  . RLS (restless legs syndrome) 09/22/2015  . Chronic hepatitis C ([facility]) 09/22/2015  . 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

## 2016-12-13 ENCOUNTER — Encounter: Payer: Medicare Other | Admitting: Physical Therapy

## 2016-12-15 IMAGING — CT CT ABD-PELV W/ CM
2 of 5 series · 17 of 46 positions shown, 19 images · IV contrast (Omnipaque 300)
Comparison: Ultrasound 09/30/2015

CLINICAL DATA: Gallbladder removed [REDACTED] 09/03/2016. Right upper
quadrant pain and distention. Shortness of breath.

EXAM:
CT ABDOMEN AND PELVIS WITH CONTRAST
TECHNIQUE: Multidetector CT imaging of the abdomen and pelvis was performed
using the standard protocol following bolus administration of
intravenous contrast.
CONTRAST:  100mL OMNIPAQUE IOHEXOL 300 MG/ML  SOLN

[Series 2: abd_pel_with 5.0 b40f · axial · 0.87mm/px · z∈[-458,-13]mm · 14 of 103 slices shown, 16 images]
[im 7/103  soft-tissue]
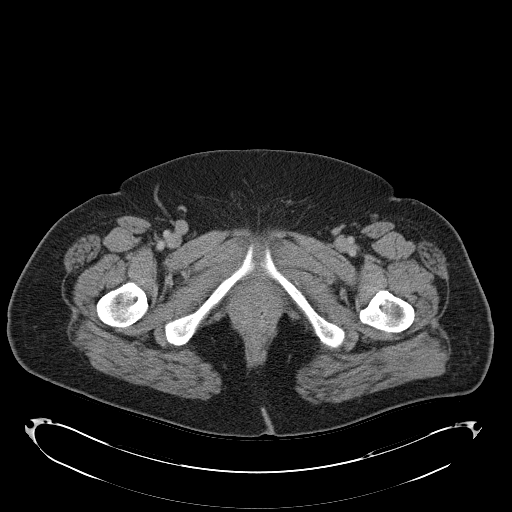
[im 7/103  bone]
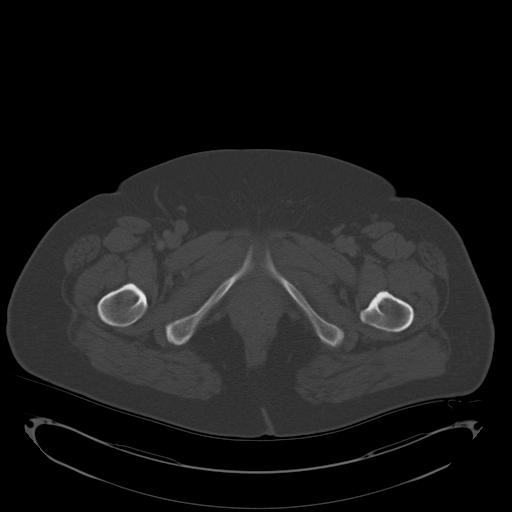
[im 13/103  soft-tissue]
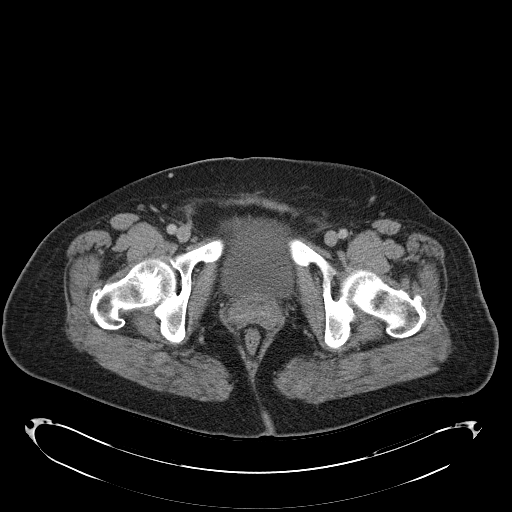
[im 20/103  soft-tissue]
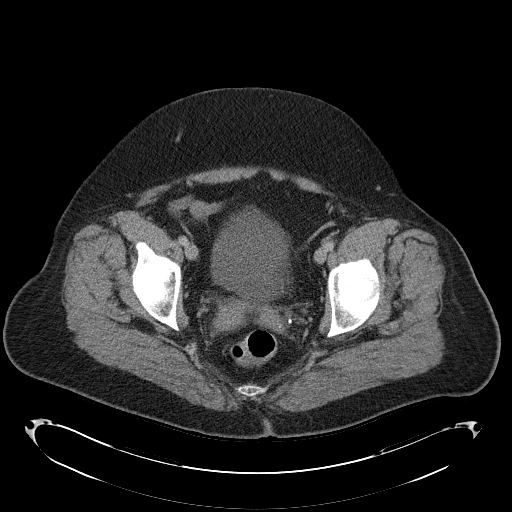
[im 26/103  soft-tissue]
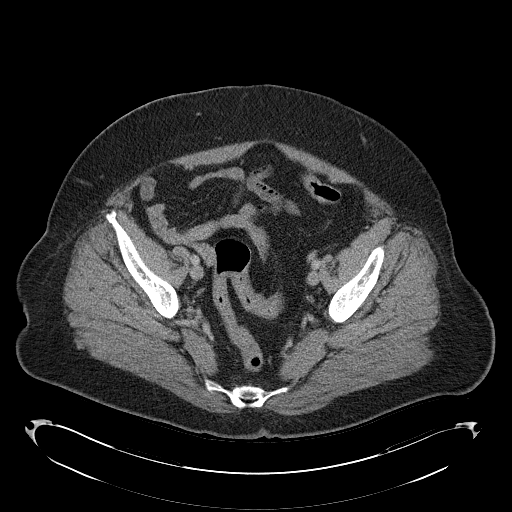
[im 32/103  soft-tissue]
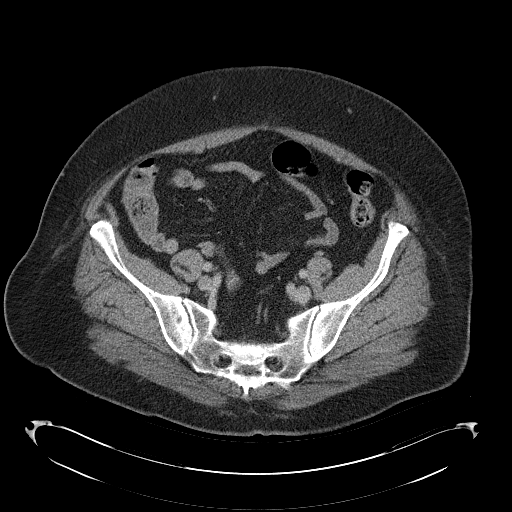
[im 39/103  soft-tissue]
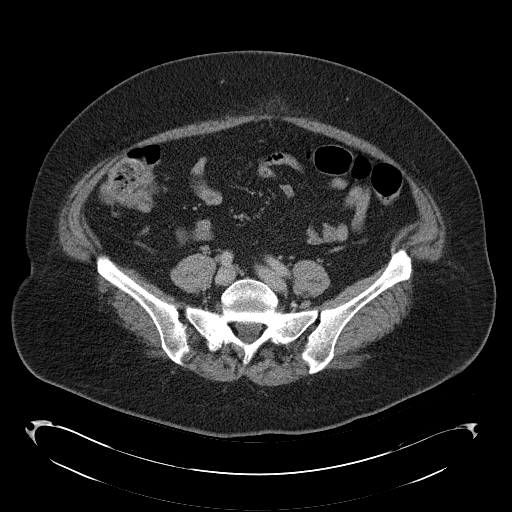
[im 45/103  soft-tissue]
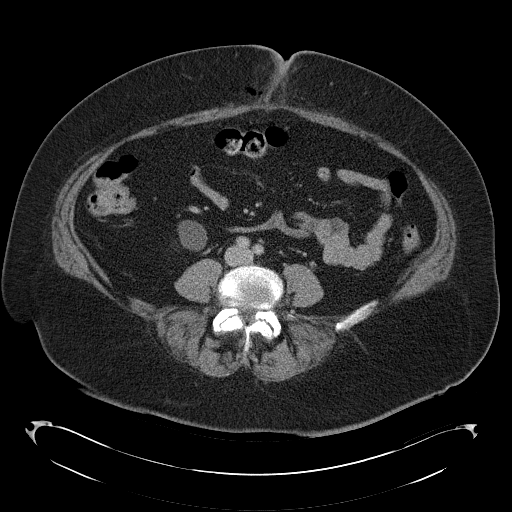
[im 58/103  soft-tissue]
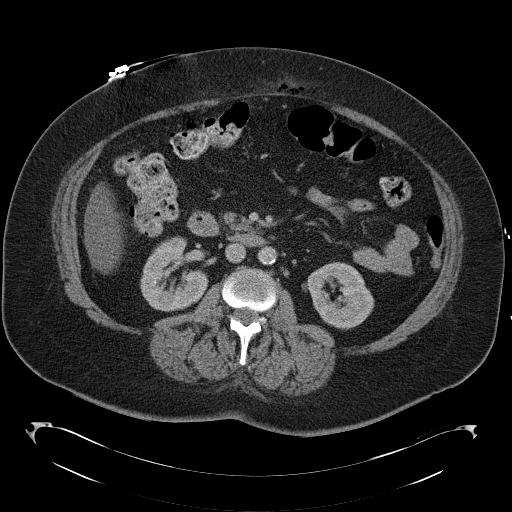
[im 64/103  soft-tissue]
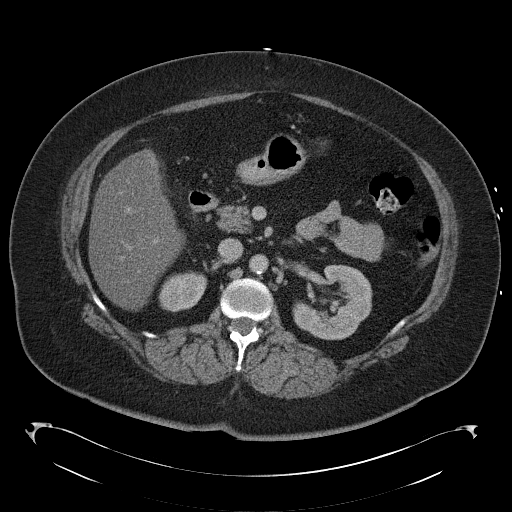
[im 64/103  bone]
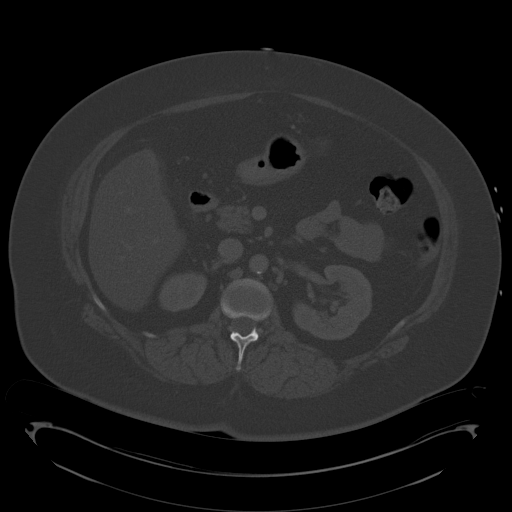
[im 71/103  soft-tissue]
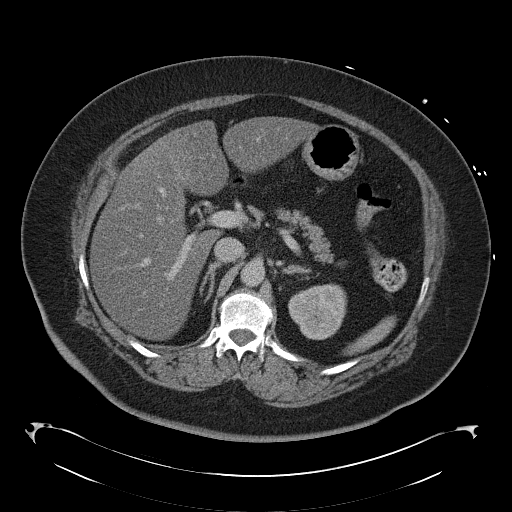
[im 77/103  soft-tissue]
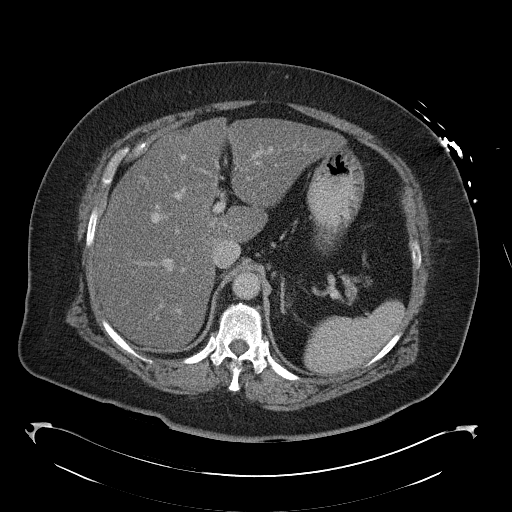
[im 83/103  soft-tissue]
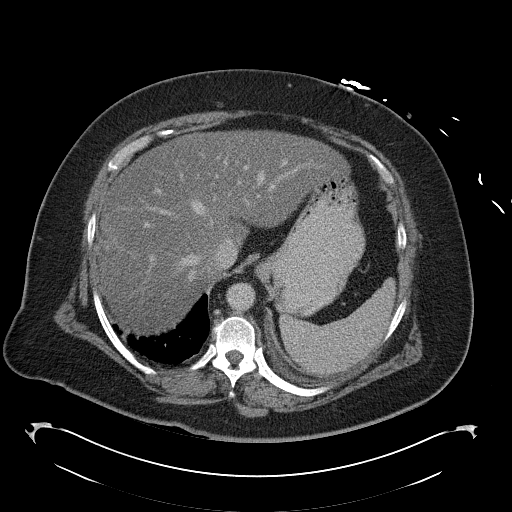
[im 90/103  soft-tissue]
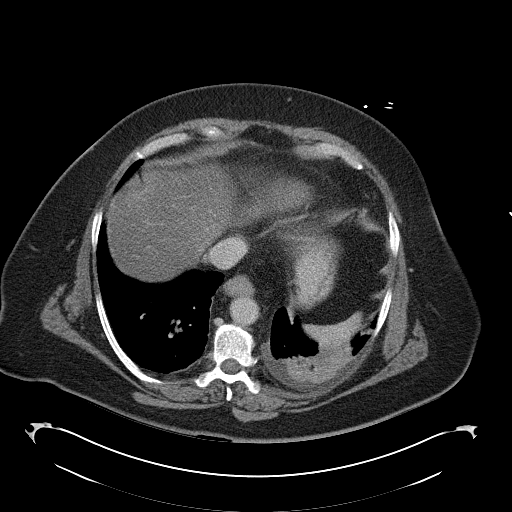
[im 96/103  soft-tissue]
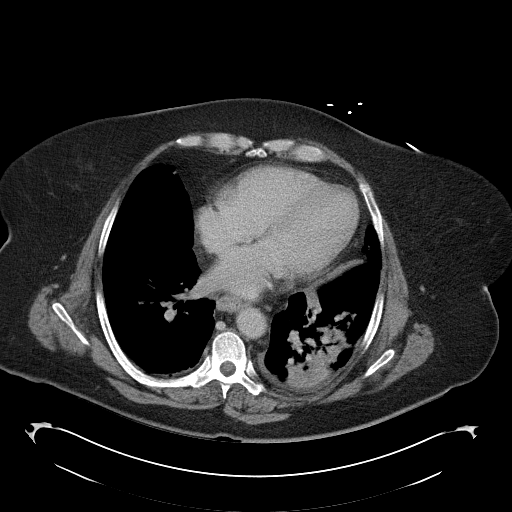

[Series 4: abd_pel_with 3.0 spo cor · coronal · 0.89mm/px · 3 of 91 slices shown]
[im 31/91  soft-tissue]
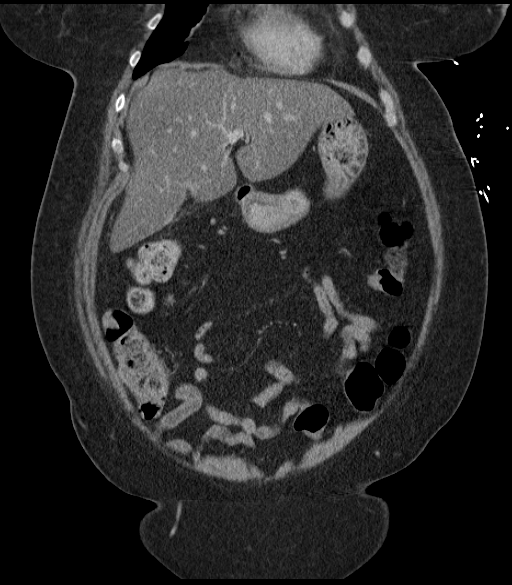
[im 41/91  soft-tissue]
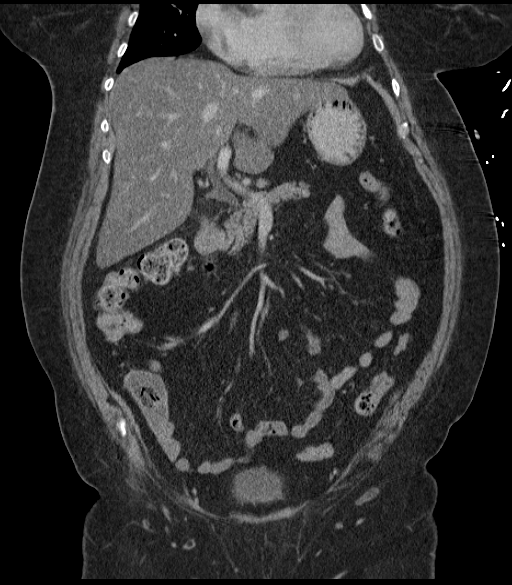
[im 51/91  soft-tissue]
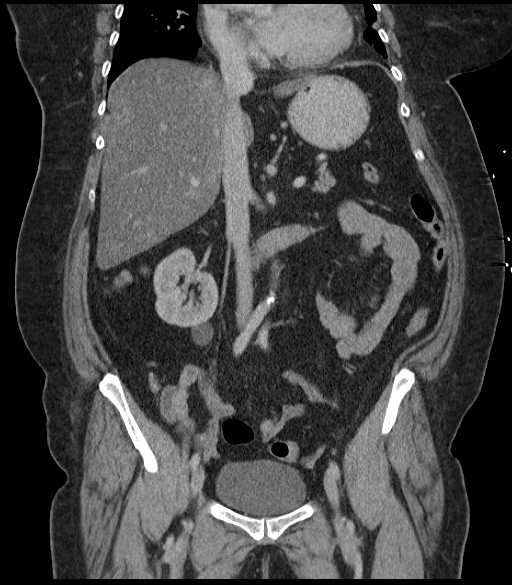

[17 of 46 positions shown; findings below may reference images not displayed]

FINDINGS: Dense consolidation in the left lower lobe concerning for pneumonia.
Small left pleural effusion. Heart is borderline in size.

Diffuse fatty infiltration of the liver. Changes of cholecystectomy.
No focal fluid collection in the gallbladder fossa. Spleen,
pancreas, adrenals and kidneys are unremarkable. Postoperative
stranding and gas noted in the region of the umbilicus. No fluid
collection.

Prior hysterectomy. No adnexal masses. Urinary bladder is
unremarkable. Bowel grossly unremarkable. No free fluid, free air,
or adenopathy. Aorta is normal caliber with scattered
calcifications.

No acute bony abnormality or focal bone lesion.
IMPRESSION: Dense consolidation in the left lower lobe with small left pleural
effusion. Findings concerning for pneumonia.

Fatty liver.

Prior cholecystectomy without visible complicating feature.

## 2016-12-15 IMAGING — CR DG CHEST 1V PORT
1 series · 1 of 1 positions shown · non-contrast
Comparison: None.

CLINICAL DATA: Cough

EXAM:
PORTABLE CHEST 1 VIEW

[ap portable]
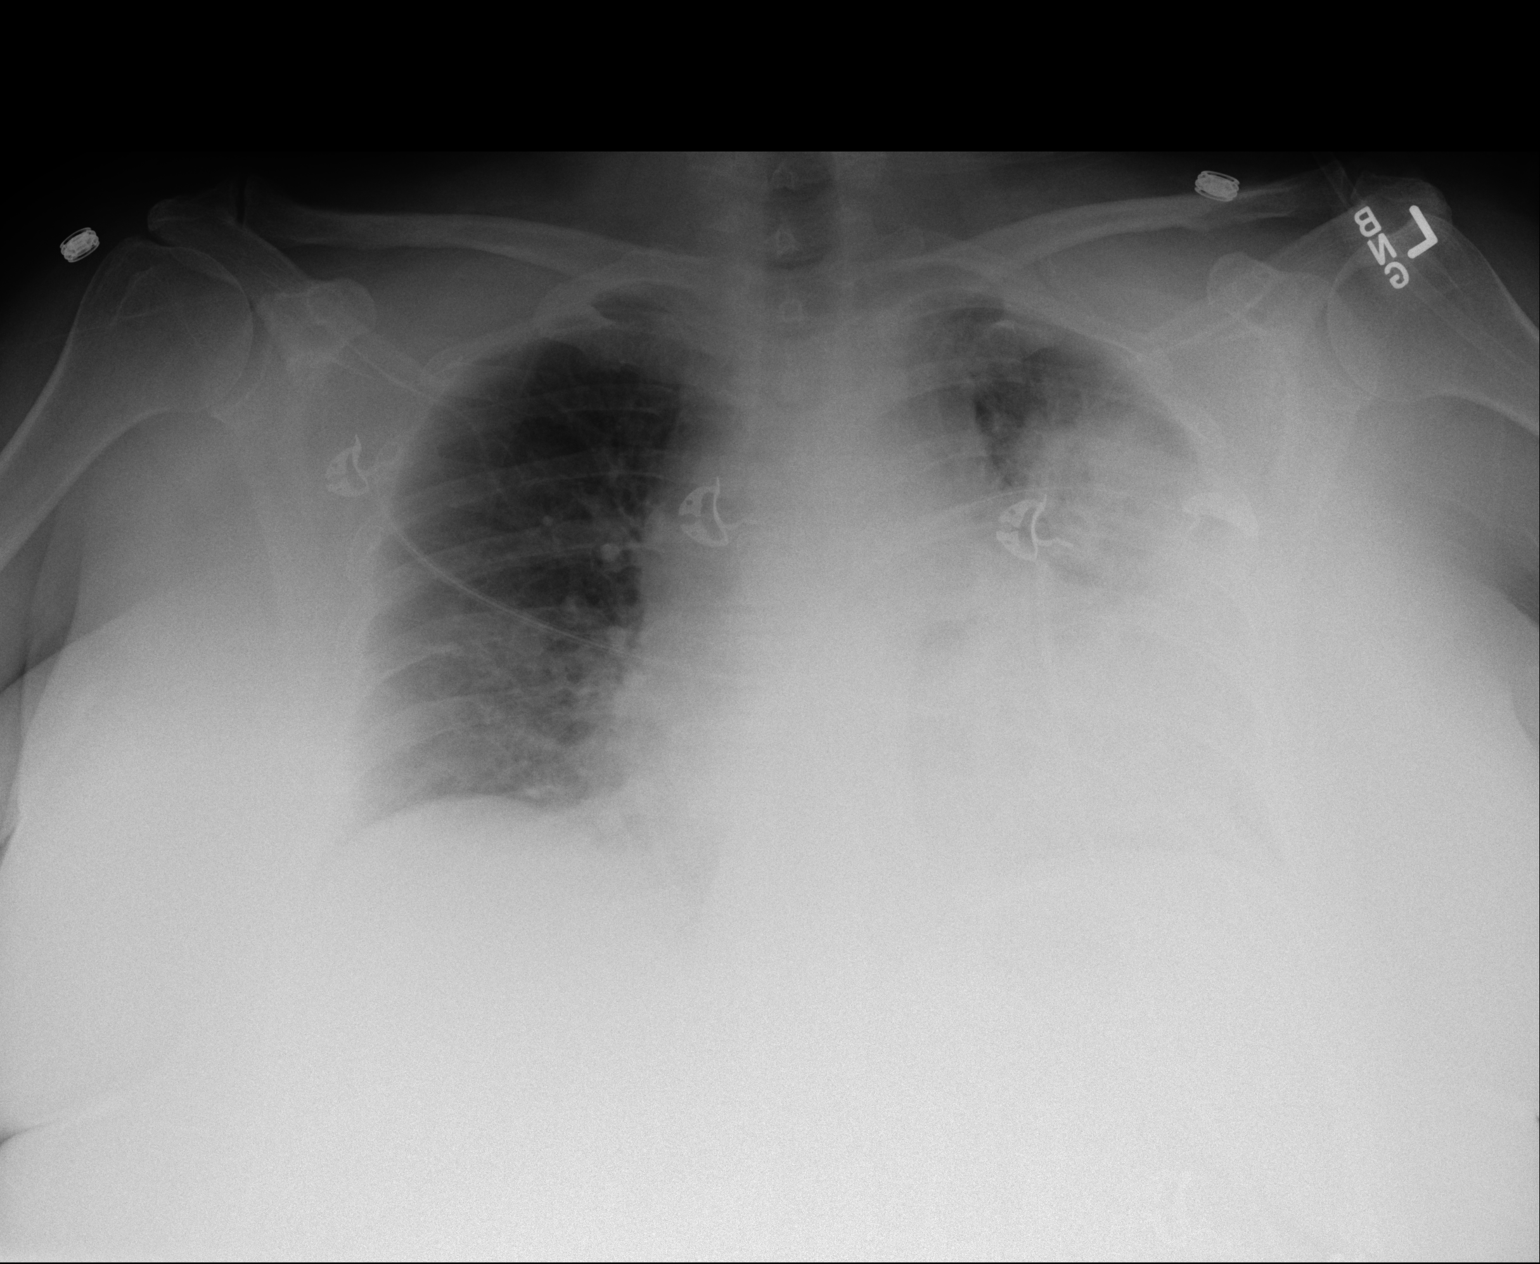

[1 of 1 positions shown; findings below may reference images not displayed]

FINDINGS: Dense consolidation in the left lung. Very low lung volumes with
right base atelectasis. Cardiomegaly with vascular congestion. No
acute bony abnormality.
IMPRESSION: Dense consolidation throughout the left lung compatible with
pneumonia. Low lung volumes. Right base atelectasis.

Cardiomegaly, vascular congestion.

## 2016-12-17 IMAGING — CR DG CHEST 1V PORT
1 series · 1 of 1 positions shown · non-contrast
Comparison: November 08, 2015.

CLINICAL DATA: Cough.  Pneumonia.

EXAM:
PORTABLE CHEST 1 VIEW

[ap portable]
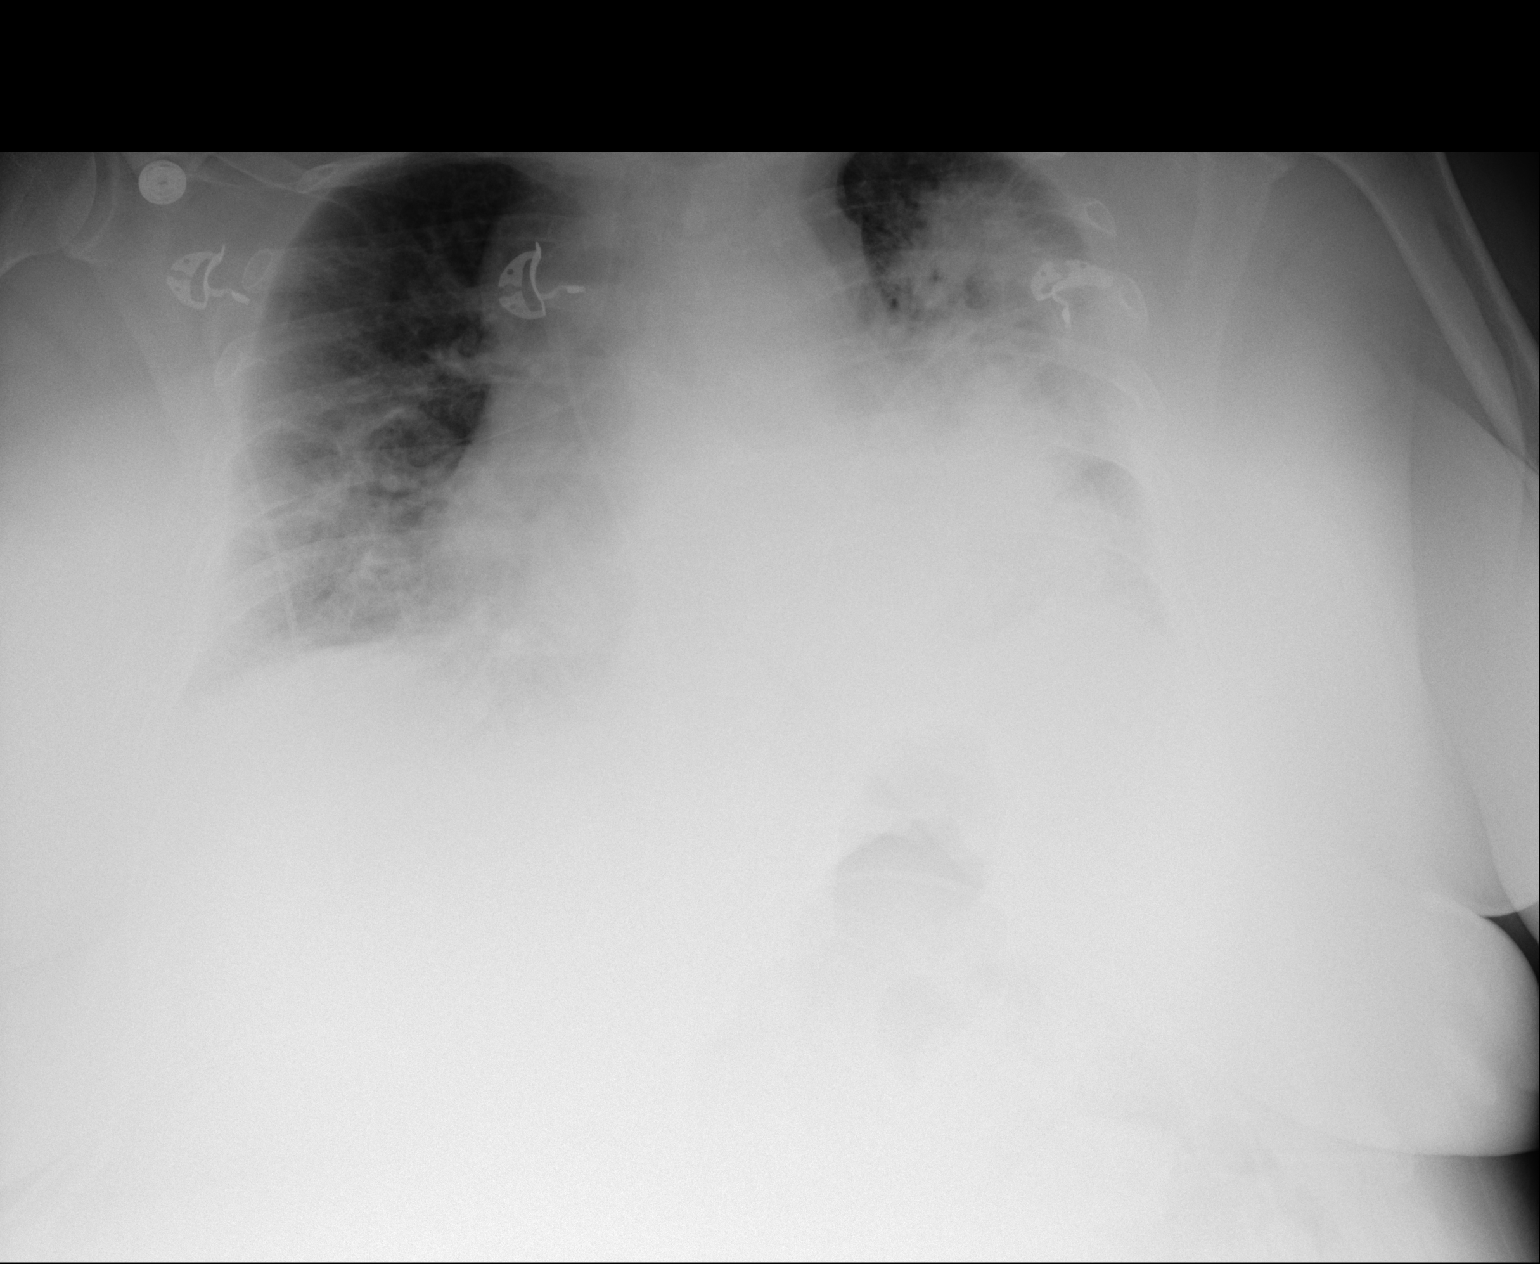

[1 of 1 positions shown; findings below may reference images not displayed]

FINDINGS: Stable cardiomegaly. Stable large airspace opacity is noted in left
lung. Increased right basilar opacity is noted concerning for
pneumonia or atelectasis. No definite pneumothorax is noted.
Visualized portion of bony thorax is unremarkable.
IMPRESSION: Stable large left airspace opacity is noted concerning for
pneumonia, but continued radiographic follow-up is recommended to
rule out underlying neoplasm. Increased right basilar opacity is
noted concerning for developing pneumonia or atelectasis.

## 2016-12-18 ENCOUNTER — Encounter: Payer: Self-pay | Admitting: Physical Therapy

## 2016-12-18 ENCOUNTER — Ambulatory Visit: Payer: Medicare Other | Admitting: Physical Therapy

## 2016-12-18 DIAGNOSIS — M25512 Pain in left shoulder: Secondary | ICD-10-CM | POA: Diagnosis not present

## 2016-12-18 DIAGNOSIS — G8929 Other chronic pain: Secondary | ICD-10-CM | POA: Diagnosis not present

## 2016-12-18 DIAGNOSIS — M25612 Stiffness of left shoulder, not elsewhere classified: Secondary | ICD-10-CM | POA: Diagnosis not present

## 2016-12-18 NOTE — Therapy (Signed)
[facility] [place], [place], [address] Phone: [phone]   Fax:  [phone]  Physical Therapy Treatment  Patient Details  Name: Catherine Farrell MRN: [id number] Date of Birth: 01-06-1956 Referring Provider: Justice Britain, MD   Encounter Date: 12/18/2016      PT End of Session - 12/18/16 1401    Visit Number 7   Number of Visits 12   Date for PT Re-Evaluation 01/13/17   PT Start Time 1300   PT Stop Time 1345   PT Time Calculation (min) 45 min   Activity Tolerance Patient tolerated treatment well   Behavior During Therapy [facility] for tasks assessed/performed      Past Medical History:  Diagnosis Date  . Bipolar 1 disorder ([place])   . Blood transfusion without reported diagnosis   . Cholelithiasis 10/03/2015   GB US 01/11/2014 was NEGATIVE for gallstones, wall thickening or pericholecystic fluid.   . Depression   . Hepatitis C   . Hypertension   . Neuromuscular disorder ([place])   . PNA (pneumonia) 08/2009   viral  . Restless leg   . Shingles 06/2013   was outside acute window for treatment, started on gabapentin    Past Surgical History:  Procedure Laterality Date  . ABDOMINAL HYSTERECTOMY     partial hysterectomy due to fibroids  . BLADDER SURGERY    . CHOLECYSTECTOMY    . COLON SURGERY    . HERNIA REPAIR    . TUBAL LIGATION      There were no vitals filed for this visit.      Subjective Assessment - 12/18/16 1357    Subjective pt arriving to therapy with complaints of pain 4/10 in left UE   Pertinent History Chronic left shoulder pain over the last year.   Patient Stated Goals Use left arm without pain   Currently in Pain? Yes   Pain Score 4    Pain Location Shoulder   Pain Orientation Left   Pain Descriptors / Indicators Aching   Pain Type Chronic pain   Pain Onset More than a month ago   Pain Frequency Constant   Aggravating Factors  certain movements   Pain Relieving Factors at rest   Multiple  Pain Sites No                         [facility] Adult PT Treatment/Exercise - 12/18/16 0001      Shoulder Exercises: Standing   Protraction Strengthening   Theraband Level (Shoulder Protraction) Level 3 (Green)   External Rotation Strengthening;Left;20 reps;Theraband  half range   Theraband Level (Shoulder External Rotation) Level 3 (Green)   Internal Rotation Strengthening;15 reps;Theraband   Theraband Level (Shoulder Internal Rotation) Level 3 (Green)   Row Strengthening;Left;20 reps;Theraband  half range   Theraband Level (Shoulder Row) Level 3 (Green)     Shoulder Exercises: Pulleys   Flexion 3 minutes   Other Pulley Exercises Wall Ladder flexion: #18   Other Pulley Exercises Standing UE ranger     Cryotherapy   Number Minutes Cryotherapy 15 Minutes   Cryotherapy Location Shoulder   Type of Cryotherapy Ice pack     Electrical Stimulation   Electrical Stimulation Location L shoulder   Electrical Stimulation Action IFC   Electrical Stimulation Parameters 80-150Hz  x 15   Electrical Stimulation Goals Pain     Manual Therapy   Manual Therapy Soft tissue mobilization;Passive ROM   Soft tissue mobilization anterior deltoid, bicep and  Upper trap   Passive ROM gentle PROM in all planes                PT Education - 12/18/16 1400    Education Details Discussed HEP progression and flexion ROM   Person(s) Educated Patient   Methods Explanation   Comprehension Verbalized understanding          PT Short Term Goals - 12/04/16 1403      PT SHORT TERM GOAL #1   Title Ind with a HEP.   Time 2   Period Weeks   Status Achieved           PT Long Term Goals - 12/18/16 1403      PT LONG TERM GOAL #1   Title Active left shoulder flexion to 155 degrees so the patient can easily reach overhead   Baseline Left shoulder active ROM in supine 155 on 12/18/16   Time 4   Period Weeks   Status Partially Met     PT LONG TERM GOAL #2   Title Active ER to  80 degrees+ to allow for easily donning/doffing of apparel   Time 4   Period Weeks   Status On-going     PT LONG TERM GOAL #3   Title Increase ROM so patient is able to reach behind back to L3.   Baseline Pt able to reach to T 12.  12/18/16   Time 4   Period Weeks   Status Achieved     PT LONG TERM GOAL #4   Title Increase left shoulder strength to a solid 4+/5 to increase stability for performance of functional activities   Baseline 4-/5 on 12/18/16   Time 4   Period Weeks   Status On-going     PT LONG TERM GOAL #5   Title Perform ADL's with left shoulder pain not > 3/10.   Baseline Patient reports her pain varies depending on the day and can be as great as 5/10 with ADL's. 12/18/16   Time 4   Status On-going               Plan - 12/18/16 1401    Clinical Impression Statement Patient arriving to clinic today reporting 4/10 pain in left shoulder and at end of session pt reporting 2/10 pain. Pt tolerating exercises well with increased resistance from yellow therabands to green therabands today. PROM performed. No new goals met today. Continue to progress with ROM, Strengthening and functinoal mobility.    Rehab Potential Excellent   PT Frequency 3x / week   PT Duration 4 weeks   PT Treatment/Interventions ADLs/Self Care Home Management;Cryotherapy;Electrical Stimulation;Moist Heat;Ultrasound;Patient/family education;Therapeutic exercise;Therapeutic activities;Manual techniques;Passive range of motion;Vasopneumatic Device   PT Next Visit Plan cont with POC per MPT   PT Home Exercise Plan isometrics, cane: flex, ext and IR. Active Shoulder flexion wall slides   Consulted and Agree with Plan of Care Patient      Patient will benefit from skilled therapeutic intervention in order to improve the following deficits and impairments:  Pain, Decreased range of motion, Decreased activity tolerance, Impaired UE functional use, Postural dysfunction  Visit Diagnosis: Stiffness of left  shoulder, not elsewhere classified  Acute pain of left shoulder     Problem List Patient Active Problem List   Diagnosis Date Noted  . Adhesive capsulitis of left shoulder 03/20/2016  . CAP (community acquired pneumonia) 11/08/2015  . Acute respiratory failure with hypoxia ([place]) 11/08/2015  . Abnormal thyroid function test 11/02/2015  .  Hyperlipidemia 11/02/2015  . History of tobacco use 11/02/2015  . Myalgia and myositis 11/02/2015  . Bipolar 1 disorder ([place]) 09/22/2015  . HTN (hypertension) 09/22/2015  . RLS (restless legs syndrome) 09/22/2015  . Chronic hepatitis C ([place]) 09/22/2015  . sensory trigeminal neuropathy 09/22/2015    Oretha Caprice, MPT 12/18/2016, 2:15 PM  [facility] [place], [place], [address] Phone: [phone]   Fax:  [phone]  Name: Ellen Goris MRN: [id number] Date of Birth: 1956-03-08

## 2016-12-20 ENCOUNTER — Encounter: Payer: Self-pay | Admitting: Physical Therapy

## 2016-12-20 ENCOUNTER — Ambulatory Visit: Payer: Medicare Other | Admitting: Physical Therapy

## 2016-12-20 DIAGNOSIS — M25512 Pain in left shoulder: Secondary | ICD-10-CM

## 2016-12-20 DIAGNOSIS — G8929 Other chronic pain: Secondary | ICD-10-CM | POA: Diagnosis not present

## 2016-12-20 DIAGNOSIS — M25612 Stiffness of left shoulder, not elsewhere classified: Secondary | ICD-10-CM

## 2016-12-20 NOTE — Therapy (Signed)
[facility] [address] [place], [place], [address] Phone: [phone]   Fax:  [phone]  Physical Therapy Treatment  Patient Details  Name: Catherine StallionCarolyn Farrell MRN: [id number] Date of Birth: 03/05/1956 Referring Provider: Francena HanlyKevin Supple, MD   Encounter Date: 12/20/2016      PT End of Session - 12/20/16 1356    Visit Number 7   Number of Visits 12   Date for PT Re-Evaluation 01/13/17   PT Start Time 1315   PT Stop Time 1413   PT Time Calculation (min) 58 min   Activity Tolerance Patient tolerated treatment well   Behavior [facility] Therapy [facility] for tasks assessed/performed      Past Medical History:  Diagnosis Date  . Bipolar 1 disorder ([facility])   . Blood transfusion without reported diagnosis   . Cholelithiasis 10/03/2015   GB US 01/11/2014 was NEGATIVE for gallstones, wall thickening or pericholecystic fluid.   . Depression   . Hepatitis C   . Hypertension   . Neuromuscular disorder ([facility])   . PNA (pneumonia) 08/2009   viral  . Restless leg   . Shingles 06/2013   was outside acute window for treatment, started on gabapentin    Past Surgical History:  Procedure Laterality Date  . ABDOMINAL HYSTERECTOMY     partial hysterectomy due to fibroids  . BLADDER SURGERY    . CHOLECYSTECTOMY    . COLON SURGERY    . HERNIA REPAIR    . TUBAL LIGATION      There were no vitals filed for this visit.      Subjective Assessment - 12/20/16 1324    Subjective Patient feeling overall better   Pertinent History Chronic left shoulder pain over the last year.   Patient Stated Goals Use left arm without pain   Currently in Pain? Yes   Pain Score 3    Pain Location Shoulder   Pain Orientation Left   Pain Descriptors / Indicators Aching   Pain Type Surgical pain   Pain Onset More than a month ago   Pain Frequency Intermittent   Aggravating Factors  ceratin movements   Pain Relieving Factors at rest            [facility] PT Assessment -  12/20/16 0001      AROM   AROM Assessment Site Shoulder   Right/Left Shoulder Left   Left Shoulder Flexion 145 Degrees   Left Shoulder External Rotation 70 Degrees     PROM   PROM Assessment Site Shoulder   Right/Left Shoulder Left   Left Shoulder Flexion 151 Degrees   Left Shoulder External Rotation 80 Degrees                     [facility] Adult PT Treatment/Exercise - 12/20/16 0001      Shoulder Exercises: Standing   Protraction Strengthening;Left;Theraband  2x10, x5   Theraband Level (Shoulder Protraction) Level 2 (Red)   External Rotation Strengthening;Left;Theraband  2x10, x5   Theraband Level (Shoulder External Rotation) Level 1 (Yellow)  red too dificult   Internal Rotation Strengthening;Left;Theraband  3x10   Theraband Level (Shoulder Internal Rotation) Level 2 (Red)   Row Strengthening;Left;Theraband  3x10   Theraband Level (Shoulder Row) Level 2 (Red)     Shoulder Exercises: Pulleys   Flexion Other (comment)  5min   Other Pulley Exercises wall ladder x454min     Modalities   Modalities Vasopneumatic;Electrical Stimulation     Electrical Stimulation   Electrical Stimulation  Location L shoulder   Electrical Stimulation Action IFC   Electrical Stimulation Parameters 80-150hz  x57min   Electrical Stimulation Goals Pain     Vasopneumatic   Number Minutes Vasopneumatic  15 minutes   Vasopnuematic Location  Shoulder   Vasopneumatic Pressure Medium     Manual Therapy   Manual Therapy Passive ROM   Passive ROM manual PROM for shoulder flexion and ER with genlt hold end range                  PT Short Term Goals - 12/04/16 1403      PT SHORT TERM GOAL #1   Title Ind with a HEP.   Time 2   Period Weeks   Status Achieved           PT Long Term Goals - 12/20/16 1330      PT LONG TERM GOAL #1   Title Active left shoulder flexion to 155 degrees so the patient can easily reach overhead   Baseline Left shoulder active ROM in supine  155 on 12/18/16   Time 4   Period Weeks   Status On-going  AROM 145 degrres 12/20/16     PT LONG TERM GOAL #2   Title Active ER to 80 degrees+ to allow for easily donning/doffing of apparel   Time 4   Period Weeks   Status On-going  AROM 70 degrees 12/20/16     PT LONG TERM GOAL #3   Title Increase ROM so patient is able to reach behind back to L3.   Baseline Pt able to reach to T 12.  12/18/16   Time 4   Period Weeks   Status Achieved     PT LONG TERM GOAL #4   Title Increase left shoulder strength to a solid 4+/5 to increase stability for performance of functional activities   Baseline 4-/5 on 12/18/16   Time 4   Period Weeks   Status On-going     PT LONG TERM GOAL #5   Title Perform ADL's with left shoulder pain not > 3/10.   Baseline Patient reports her pain varies depending on the day and can be as great as 5/10 with ADL's. 12/18/16   Time 4   Period Weeks   Status On-going  5/10 with ADL's 12/20/16               Plan - 12/20/16 1358    Clinical Impression Statement Patient tolerated treatment well today. Patient has less pain overall reported and progressing with ROM and strength exercises. Patient has reported pain up to 5/10 with ADL's currently. Patient has improved ER and flexion ROM today. Patient unable to meet any further goals due to pain, strength and full ROM deficts, yet progressing.   Rehab Potential Excellent   PT Frequency 3x / week   PT Duration 4 weeks   PT Treatment/Interventions ADLs/Self Care Home Management;Cryotherapy;Electrical Stimulation;Moist Heat;Ultrasound;Patient/family education;Therapeutic exercise;Therapeutic activities;Manual techniques;Passive range of motion;Vasopneumatic Device   PT Next Visit Plan cont with POC per MD. Supple    Consulted and Agree with Plan of Care Patient      Patient will benefit from skilled therapeutic intervention in order to improve the following deficits and impairments:  Pain, Decreased range of  motion, Decreased activity tolerance, Impaired UE functional use, Postural dysfunction  Visit Diagnosis: Stiffness of left shoulder, not elsewhere classified  Acute pain of left shoulder  Chronic left shoulder pain     Problem List Patient Active Problem List  Diagnosis Date Noted  . Adhesive capsulitis of left shoulder 03/20/2016  . CAP (community acquired pneumonia) 11/08/2015  . Acute respiratory failure with hypoxia ([facility]) 11/08/2015  . Abnormal thyroid function test 11/02/2015  . Hyperlipidemia 11/02/2015  . History of tobacco use 11/02/2015  . Myalgia and myositis 11/02/2015  . Bipolar 1 disorder ([facility]) 09/22/2015  . HTN (hypertension) 09/22/2015  . RLS (restless legs syndrome) 09/22/2015  . Chronic hepatitis C (H[facility]) 09/22/2015  . sensory trigeminal neuropathy 09/22/2015   Cathie Hoops, PTA 12/20/16 2:55 PM [place] MPT [facility] [address] [place], [place], [address] Phone: [phone]   Fax:  [phone]  Name: Catherine Farrell MRN: [id number] Date of Birth: 1956-03-04

## 2016-12-24 DIAGNOSIS — Z4789 Encounter for other orthopedic aftercare: Secondary | ICD-10-CM | POA: Diagnosis not present

## 2016-12-25 ENCOUNTER — Encounter: Payer: Medicare Other | Admitting: Physical Therapy

## 2016-12-27 ENCOUNTER — Ambulatory Visit: Payer: Medicare Other | Admitting: Physical Therapy

## 2016-12-27 DIAGNOSIS — G8929 Other chronic pain: Secondary | ICD-10-CM | POA: Diagnosis not present

## 2016-12-27 DIAGNOSIS — M25612 Stiffness of left shoulder, not elsewhere classified: Secondary | ICD-10-CM | POA: Diagnosis not present

## 2016-12-27 DIAGNOSIS — M25512 Pain in left shoulder: Secondary | ICD-10-CM | POA: Diagnosis not present

## 2016-12-27 NOTE — Therapy (Signed)
[facility] [address] [place], [place], [address] Phone: [phone]   Fax:  [phone]  Physical Therapy Treatment  Patient Details  Name: Catherine Farrell MRN: [id number] Date of Birth: 05-26-1956 Referring Provider: Francena Hanly, MD   Encounter Date: 12/27/2016    Past Medical History:  Diagnosis Date  . Bipolar 1 disorder ([facility])   . Blood transfusion without reported diagnosis   . Cholelithiasis 10/03/2015   GB US 01/11/2014 was NEGATIVE for gallstones, wall thickening or pericholecystic fluid.   . Depression   . Hepatitis C   . Hypertension   . Neuromuscular disorder ([facility])   . PNA (pneumonia) 08/2009   viral  . Restless leg   . Shingles 06/2013   was outside acute window for treatment, started on gabapentin    Past Surgical History:  Procedure Laterality Date  . ABDOMINAL HYSTERECTOMY     partial hysterectomy due to fibroids  . BLADDER SURGERY    . CHOLECYSTECTOMY    . COLON SURGERY    . HERNIA REPAIR    . TUBAL LIGATION      There were no vitals filed for this visit.      Subjective Assessment - 12/27/16 1514    Subjective New order to continue.                         [facility] Adult PT Treatment/Exercise - 12/27/16 0001      Exercises   Exercises Shoulder     Shoulder Exercises: Standing   Other Standing Exercises RW 4 to fatigue all motions.   Other Standing Exercises ER with 2# with left shoulder held and supported at 45 degrees to fatigue x 2.     Shoulder Exercises: Pulleys   Flexion Limitations 7 minutes.   Other Pulley Exercises UE Ranger on wall x 6 minutes.     Vasopneumatic   Number Minutes Vasopneumatic  15 minutes   Vasopnuematic Location  --  Left shoulder.   Vasopneumatic Pressure Medium                  PT Short Term Goals - 12/04/16 1403      PT SHORT TERM GOAL #1   Title Ind with a HEP.   Time 2   Period Weeks   Status Achieved           PT  Long Term Goals - 12/20/16 1330      PT LONG TERM GOAL #1   Title Active left shoulder flexion to 155 degrees so the patient can easily reach overhead   Baseline Left shoulder active ROM in supine 155 on 12/18/16   Time 4   Period Weeks   Status On-going  AROM 145 degrres 12/20/16     PT LONG TERM GOAL #2   Title Active ER to 80 degrees+ to allow for easily donning/doffing of apparel   Time 4   Period Weeks   Status On-going  AROM 70 degrees 12/20/16     PT LONG TERM GOAL #3   Title Increase ROM so patient is able to reach behind back to L3.   Baseline Pt able to reach to T 12.  12/18/16   Time 4   Period Weeks   Status Achieved     PT LONG TERM GOAL #4   Title Increase left shoulder strength to a solid 4+/5 to increase stability for performance of functional activities   Baseline 4-/5 on 12/18/16  Time 4   Period Weeks   Status On-going     PT LONG TERM GOAL #5   Title Perform ADL's with left shoulder pain not > 3/10.   Baseline Patient reports her pain varies depending on the day and can be as great as 5/10 with ADL's. 12/18/16   Time 4   Period Weeks   Status On-going  5/10 with ADL's 12/20/16               Plan - 12/27/16 1533    Clinical Impression Statement Patient to continue physical therapy.  Continue with PRE.      Patient will benefit from skilled therapeutic intervention in order to improve the following deficits and impairments:  Pain, Decreased range of motion, Decreased activity tolerance, Impaired UE functional use, Postural dysfunction  Visit Diagnosis: Stiffness of left shoulder, not elsewhere classified  Acute pain of left shoulder  Chronic left shoulder pain     Problem List Patient Active Problem List   Diagnosis Date Noted  . Adhesive capsulitis of left shoulder 03/20/2016  . CAP (community acquired pneumonia) 11/08/2015  . Acute respiratory failure with hypoxia (HCC) 11/08/2015  . Abnormal thyroid function test 11/02/2015  .  Hyperlipidemia 11/02/2015  . History of tobacco use 11/02/2015  . Myalgia and myositis 11/02/2015  . Bipolar 1 disorder (HCC) 09/22/2015  . HTN (hypertension) 09/22/2015  . RLS (restless legs syndrome) 09/22/2015  . Chronic hepatitis C (HCC) 09/22/2015  . sensory trigeminal neuropathy 09/22/2015    Merrell [place], [place] 12/27/2016, 3:36 PM  [facility] [address] [place], [place], [id number] Phone: [phone]   Fax:  [phone]  Name: Catherine Farrell MRN: [id number] Date of Birth: 02/26/1956

## 2017-01-01 ENCOUNTER — Encounter: Payer: Medicare Other | Admitting: *Deleted

## 2017-01-03 ENCOUNTER — Ambulatory Visit: Payer: Medicare Other | Admitting: Physical Therapy

## 2017-01-03 ENCOUNTER — Encounter: Payer: Self-pay | Admitting: Physical Therapy

## 2017-01-03 DIAGNOSIS — G8929 Other chronic pain: Secondary | ICD-10-CM

## 2017-01-03 DIAGNOSIS — M25612 Stiffness of left shoulder, not elsewhere classified: Secondary | ICD-10-CM

## 2017-01-03 DIAGNOSIS — M25512 Pain in left shoulder: Secondary | ICD-10-CM

## 2017-01-03 NOTE — Therapy (Addendum)
[facility] [place], [place], [address] Phone: [phone]   Fax:  [phone]  Physical Therapy Treatment  Patient Details  Name: Catherine Farrell MRN: [id number] Date of Birth: 12-Oct-1955 Referring Provider: Justice Britain, MD   Encounter Date: 01/03/2017      PT End of Session - 01/03/17 1429    Visit Number 9   Number of Visits 12   Date for PT Re-Evaluation 01/13/17   PT Start Time 0165   PT Stop Time 1446   PT Time Calculation (min) 48 min   Activity Tolerance Patient tolerated treatment well   Behavior During Therapy [facility] for tasks assessed/performed      Past Medical History:  Diagnosis Date  . Bipolar 1 disorder ([place])   . Blood transfusion without reported diagnosis   . Cholelithiasis 10/03/2015   GB US 01/11/2014 was NEGATIVE for gallstones, wall thickening or pericholecystic fluid.   . Depression   . Hepatitis C   . Hypertension   . Neuromuscular disorder ([place])   . PNA (pneumonia) 08/2009   viral  . Restless leg   . Shingles 06/2013   was outside acute window for treatment, started on gabapentin    Past Surgical History:  Procedure Laterality Date  . ABDOMINAL HYSTERECTOMY     partial hysterectomy due to fibroids  . BLADDER SURGERY    . CHOLECYSTECTOMY    . COLON SURGERY    . HERNIA REPAIR    . TUBAL LIGATION      There were no vitals filed for this visit.      Subjective Assessment - 01/03/17 1401    Subjective Patient reported progress and minimal soreness   Pertinent History Chronic left shoulder pain over the last year.   Patient Stated Goals Use left arm without pain   Currently in Pain? Yes   Pain Score 1    Pain Location Shoulder   Pain Orientation Left   Pain Descriptors / Indicators Aching   Pain Type Surgical pain   Pain Onset More than a month ago   Pain Frequency Intermittent   Aggravating Factors  certain movements   Pain Relieving Factors at rest            [facility]  PT Assessment - 01/03/17 0001      AROM   AROM Assessment Site Shoulder   Right/Left Shoulder Left   Left Shoulder Flexion 151 Degrees   Left Shoulder External Rotation 80 Degrees     PROM   PROM Assessment Site Shoulder   Right/Left Shoulder Left   Left Shoulder Flexion 159 Degrees   Left Shoulder External Rotation 80 Degrees                     [facility] Adult PT Treatment/Exercise - 01/03/17 0001      Shoulder Exercises: Supine   Protraction Strengthening;Left;Weights  3x10   Protraction Weight (lbs) 2     Shoulder Exercises: Sidelying   External Rotation Strengthening;Left;Weights  3x10   External Rotation Weight (lbs) 2     Shoulder Exercises: Standing   Protraction Strengthening;Left;Theraband  3x10   Theraband Level (Shoulder Protraction) Level 2 (Red)   External Rotation Strengthening;Left;Theraband  3x10   Theraband Level (Shoulder External Rotation) Level 1 (Yellow)   Internal Rotation Strengthening;Left;Theraband  3x10   Theraband Level (Shoulder Internal Rotation) Level 2 (Red)   Row Strengthening;Left;Theraband  3x10   Theraband Level (Shoulder Row) Level 2 (Red)     Shoulder Exercises:  Pulleys   Flexion Other (comment)  80mn   Other Pulley Exercises UE rangetr for elevation and circles 3x10 each     Electrical Stimulation   Electrical Stimulation Location L shoulder   Electrical Stimulation Action IFC   Electrical Stimulation Parameters 80-150hz  x175m   Electrical Stimulation Goals Pain     Vasopneumatic   Number Minutes Vasopneumatic  15 minutes   Vasopnuematic Location  Shoulder   Vasopneumatic Pressure Medium     Manual Therapy   Manual Therapy Passive ROM   Passive ROM manual PROM for shoulder flexion and ER with genlt hold end range                  PT Short Term Goals - 12/04/16 1403      PT SHORT TERM GOAL #1   Title Ind with a HEP.   Time 2   Period Weeks   Status Achieved           PT Long Term  Goals - 01/03/17 1431      PT LONG TERM GOAL #1   Title Active left shoulder flexion to 155 degrees so the patient can easily reach overhead   Baseline Left shoulder active ROM in supine 155 on 12/18/16   Time 4   Period Weeks   Status On-going  AROM 151 degrees 01/03/17     PT LONG TERM GOAL #2   Title Active ER to 80 degrees+ to allow for easily donning/doffing of apparel   Time 4   Period Weeks   Status Achieved     PT LONG TERM GOAL #3   Title Increase ROM so patient is able to reach behind back to L3.   Baseline Pt able to reach to T 12.  12/18/16   Time 4   Period Weeks   Status Achieved     PT LONG TERM GOAL #4   Title Increase left shoulder strength to a solid 4+/5 to increase stability for performance of functional activities   Baseline 4-/5 on 12/18/16   Period Weeks   Status On-going     PT LONG TERM GOAL #5   Title Perform ADL's with left shoulder pain not > 3/10.   Baseline Patient reports her pain varies depending on the day and can be as great as 5/10 with ADL's. 12/18/16   Time 4   Period Weeks   Status On-going               Plan - 01/03/17 1431    Clinical Impression Statement Patient tolerated treatment well today. Patient has improved active and passive ROM for left shoulder flexion and ER. Patient is progressing with PRE's with no difficulty or pain. Patient Met LTG #2 others ongoing due to strength and pain deficts.    Rehab Potential Excellent   PT Frequency 3x / week   PT Duration 4 weeks   PT Treatment/Interventions ADLs/Self Care Home Management;Cryotherapy;Electrical Stimulation;Moist Heat;Ultrasound;Patient/family education;Therapeutic exercise;Therapeutic activities;Manual techniques;Passive range of motion;Vasopneumatic Device   PT Next Visit Plan cont with POC    Consulted and Agree with Plan of Care Patient      Patient will benefit from skilled therapeutic intervention in order to improve the following deficits and impairments:   Pain, Decreased range of motion, Decreased activity tolerance, Impaired UE functional use, Postural dysfunction  Visit Diagnosis: Stiffness of left shoulder, not elsewhere classified  Acute pain of left shoulder  Chronic left shoulder pain     Problem List Patient Active  Problem List   Diagnosis Date Noted  . Adhesive capsulitis of left shoulder 03/20/2016  . CAP (community acquired pneumonia) 11/08/2015  . Acute respiratory failure with hypoxia ([place]) 11/08/2015  . Abnormal thyroid function test 11/02/2015  . Hyperlipidemia 11/02/2015  . History of tobacco use 11/02/2015  . Myalgia and myositis 11/02/2015  . Bipolar 1 disorder ([place]) 09/22/2015  . HTN (hypertension) 09/22/2015  . RLS (restless legs syndrome) 09/22/2015  . Chronic hepatitis C ([place]) 09/22/2015  . 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  Current functional level related to goals / functional outcomes: See above.   Remaining deficits: See goal section.   Education / Equipment: HEP. Plan: Patient agrees to discharge.  Patient goals were partially met. Patient is being discharged due to not returning since the last visit.  ?????         Mali Applegate MPT

## 2017-01-09 ENCOUNTER — Encounter: Payer: Medicare Other | Admitting: Physical Therapy

## 2017-01-10 ENCOUNTER — Encounter: Payer: Medicare Other | Admitting: Physical Therapy

## 2017-01-14 DIAGNOSIS — I10 Essential (primary) hypertension: Secondary | ICD-10-CM | POA: Diagnosis not present

## 2017-01-14 DIAGNOSIS — E039 Hypothyroidism, unspecified: Secondary | ICD-10-CM | POA: Diagnosis not present

## 2017-01-14 DIAGNOSIS — G4700 Insomnia, unspecified: Secondary | ICD-10-CM | POA: Diagnosis not present

## 2017-01-15 ENCOUNTER — Encounter: Payer: Medicare Other | Admitting: *Deleted

## 2017-01-17 ENCOUNTER — Encounter: Payer: Medicare Other | Admitting: Physical Therapy

## 2017-02-11 DIAGNOSIS — F3160 Bipolar disorder, current episode mixed, unspecified: Secondary | ICD-10-CM | POA: Diagnosis not present

## 2017-02-11 DIAGNOSIS — I10 Essential (primary) hypertension: Secondary | ICD-10-CM | POA: Diagnosis not present

## 2017-03-27 DIAGNOSIS — M545 Low back pain: Secondary | ICD-10-CM | POA: Diagnosis not present

## 2017-03-27 DIAGNOSIS — N132 Hydronephrosis with renal and ureteral calculous obstruction: Secondary | ICD-10-CM | POA: Diagnosis not present

## 2017-03-27 DIAGNOSIS — R110 Nausea: Secondary | ICD-10-CM | POA: Diagnosis not present

## 2017-03-27 DIAGNOSIS — N200 Calculus of kidney: Secondary | ICD-10-CM | POA: Diagnosis not present

## 2017-03-27 DIAGNOSIS — R1084 Generalized abdominal pain: Secondary | ICD-10-CM | POA: Diagnosis not present

## 2017-03-27 DIAGNOSIS — N211 Calculus in urethra: Secondary | ICD-10-CM | POA: Diagnosis not present

## 2017-04-15 DIAGNOSIS — F3181 Bipolar II disorder: Secondary | ICD-10-CM | POA: Diagnosis not present

## 2017-04-15 DIAGNOSIS — I10 Essential (primary) hypertension: Secondary | ICD-10-CM | POA: Diagnosis not present

## 2017-07-16 DIAGNOSIS — F3181 Bipolar II disorder: Secondary | ICD-10-CM | POA: Diagnosis not present

## 2017-07-16 DIAGNOSIS — Z0000 Encounter for general adult medical examination without abnormal findings: Secondary | ICD-10-CM | POA: Diagnosis not present

## 2017-07-16 DIAGNOSIS — R1013 Epigastric pain: Secondary | ICD-10-CM | POA: Diagnosis not present

## 2017-07-16 DIAGNOSIS — Z23 Encounter for immunization: Secondary | ICD-10-CM | POA: Diagnosis not present

## 2017-07-16 DIAGNOSIS — B182 Chronic viral hepatitis C: Secondary | ICD-10-CM | POA: Diagnosis not present

## 2017-07-16 DIAGNOSIS — I10 Essential (primary) hypertension: Secondary | ICD-10-CM | POA: Diagnosis not present

## 2018-01-13 ENCOUNTER — Encounter: Payer: Self-pay | Admitting: Physician Assistant

## 2019-07-02 ENCOUNTER — Other Ambulatory Visit: Payer: Self-pay | Admitting: *Deleted

## 2019-07-02 DIAGNOSIS — Z20822 Contact with and (suspected) exposure to covid-19: Secondary | ICD-10-CM

## 2019-07-03 LAB — NOVEL CORONAVIRUS, NAA: SARS-CoV-2, NAA: NOT DETECTED

## 2019-07-07 ENCOUNTER — Telehealth: Payer: Self-pay | Admitting: General Practice

## 2019-07-07 NOTE — Telephone Encounter (Signed)
Patient informed of negative covid-19 result. She verbalized understanding.  °

## 2019-07-08 ENCOUNTER — Telehealth: Payer: Self-pay | Admitting: General Practice

## 2019-07-08 ENCOUNTER — Encounter: Payer: Self-pay | Admitting: *Deleted

## 2019-07-08 NOTE — Telephone Encounter (Signed)
Received message from [facility] agent, [name] due to pt requesting a letter to be faxed  to criminal court. The pt was tested for covid due to her brother being positive. He lives with her and she is his care giver, so she was advised to get tested and quarantine for 2 weeks. Pt missed court yesterday and was asked to have a letter faxed to court that she was tested and needs to quarantine.   Patient called. Pt states that her brother was hospitalized on Thursday Morning and tested positive for COVID-19. Pt was also tested on Thursday due to being around her brother. Pt was initially called on 07/06/19 to report results but no answer. Pt returned call on 07/07/19 and was informed of negative results. Pt states that her brother was released from the hospital on Sunday and he is at her home now. 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 Pt advised that a generalized letter could be send via MyChart that states the date she was tested along with CDC guidelines of staying home and self monitoring until test results are received. MyChart link sent via text message. Nurse stayed on the phone to ensure successful activation of MyChart link. COVID testing letter sent to pt via MyChart. No other concerns voiced at this time.

## 2019-07-23 ENCOUNTER — Other Ambulatory Visit: Payer: Self-pay

## 2019-07-23 DIAGNOSIS — Z20822 Contact with and (suspected) exposure to covid-19: Secondary | ICD-10-CM

## 2019-07-24 LAB — NOVEL CORONAVIRUS, NAA: SARS-CoV-2, NAA: NOT DETECTED

## 2019-12-31 ENCOUNTER — Ambulatory Visit: Payer: Self-pay | Attending: Internal Medicine

## 2019-12-31 ENCOUNTER — Ambulatory Visit: Payer: Self-pay

## 2019-12-31 DIAGNOSIS — Z23 Encounter for immunization: Secondary | ICD-10-CM

## 2019-12-31 NOTE — Progress Notes (Signed)
   Covid-19 Vaccination Clinic  Name:  Fotini Lemus    MRN: [id number] DOB: 03/21/1956  12/31/2019  Ms. Foskey was observed post Covid-19 immunization for 15 minutes without incident. She was provided with Vaccine Information Sheet and instruction to access the V-Safe system.   Ms. Eichel was instructed to call 911 with any severe reactions post vaccine: Marland Kitchen Difficulty breathing  . Swelling of face and throat  . A fast heartbeat  . A bad rash all over body  . Dizziness and weakness   Immunizations Administered    Name Date Dose VIS Date Route   Moderna COVID-19 Vaccine 12/31/2019 11:51 AM 0.5 mL 09/08/2019 Intramuscular   Manufacturer: Moderna   Lot: [id number]   NDC: 91505-697-94

## 2020-01-05 ENCOUNTER — Emergency Department (HOSPITAL_COMMUNITY)
Admission: EM | Admit: 2020-01-05 | Discharge: 2020-01-05 | Disposition: A | Discharge status: Home / Self Care | Payer: Self-pay | Attending: Emergency Medicine | Admitting: Emergency Medicine

## 2020-01-05 ENCOUNTER — Encounter (HOSPITAL_COMMUNITY): Payer: Self-pay | Admitting: *Deleted

## 2020-01-05 ENCOUNTER — Other Ambulatory Visit: Payer: Self-pay

## 2020-01-05 DIAGNOSIS — S20162A Insect bite (nonvenomous) of breast, left breast, initial encounter: Secondary | ICD-10-CM | POA: Insufficient documentation

## 2020-01-05 DIAGNOSIS — L089 Local infection of the skin and subcutaneous tissue, unspecified: Secondary | ICD-10-CM | POA: Insufficient documentation

## 2020-01-05 DIAGNOSIS — Y999 Unspecified external cause status: Secondary | ICD-10-CM | POA: Insufficient documentation

## 2020-01-05 DIAGNOSIS — Y9389 Activity, other specified: Secondary | ICD-10-CM | POA: Insufficient documentation

## 2020-01-05 DIAGNOSIS — W57XXXA Bitten or stung by nonvenomous insect and other nonvenomous arthropods, initial encounter: Secondary | ICD-10-CM | POA: Insufficient documentation

## 2020-01-05 DIAGNOSIS — Y929 Unspecified place or not applicable: Secondary | ICD-10-CM | POA: Insufficient documentation

## 2020-01-05 MED ORDER — DOXYCYCLINE HYCLATE 100 MG PO TABS
100.0000 mg | ORAL_TABLET | Freq: Once | ORAL | Status: AC
Start: 2020-01-05 — End: 2020-01-05
  Administered 2020-01-05: 100 mg via ORAL
  Filled 2020-01-05: qty 1

## 2020-01-05 MED ORDER — DOXYCYCLINE HYCLATE 100 MG PO CAPS: 100.0000 mg | ORAL_CAPSULE | Freq: Two times a day (BID) | ORAL | 0 refills | Status: AC

## 2020-01-05 NOTE — ED Triage Notes (Signed)
Pt with abscess to left breast noted two days ago.  Denies drainage to area.  Denies fever or N/V

## 2020-01-05 NOTE — Discharge Instructions (Addendum)
Avoid using salt or hydrogen peroxide to the wound.  Clean it with mild soap and water only.  You may keep it bandaged.  Take the antibiotic as directed until its finished.  Follow-up with your primary care provider or return to the emergency department for any worsening symptoms such as increasing pain redness or fever.

## 2020-01-05 NOTE — ED Provider Notes (Signed)
[facility] EMERGENCY DEPARTMENT Provider Note   CSN: [id number] Arrival date & time: 01/05/20  1640     History Chief Complaint  Patient presents with  . Abscess    Catherine Farrell is a 64 y.o. female.  HPI      Catherine Farrell is a 64 y.o. female who presents to the Emergency Department complaining of possible insect bite of the left breast.  She reports an open, draining wound of her breast that has been present for 2 days.  She states that she has been applying warm salt water compresses and hydrogen peroxide to the area.  She reports redness and swelling that are improving.  She denies fever, chills, pain or drainage of the nipple.  No hx of MRSA  Past Medical History:  Diagnosis Date  . Bipolar 1 disorder ([facility])   . Blood transfusion without reported diagnosis   . Cholelithiasis 10/03/2015   GB US 01/11/2014 was NEGATIVE for gallstones, wall thickening or pericholecystic fluid.   . Depression   . Hepatitis C   . Hypertension   . Neuromuscular disorder ([facility])   . PNA (pneumonia) 08/2009   viral  . Restless leg   . Shingles 06/2013   was outside acute window for treatment, started on gabapentin    Patient Active Problem List   Diagnosis Date Noted  . Adhesive capsulitis of left shoulder 03/20/2016  . CAP (community acquired pneumonia) 11/08/2015  . Acute respiratory failure with hypoxia ([facility]) 11/08/2015  . Abnormal thyroid function test 11/02/2015  . Hyperlipidemia 11/02/2015  . History of tobacco use 11/02/2015  . Myalgia and myositis 11/02/2015  . Bipolar 1 disorder ([facility]) 09/22/2015  . HTN (hypertension) 09/22/2015  . RLS (restless legs syndrome) 09/22/2015  . Chronic hepatitis C ([facility]) 09/22/2015  . 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 ABDOMINAL HYSTERECTOMY     partial hysterectomy due to fibroids  . BLADDER SURGERY    . CHOLECYSTECTOMY    . COLON SURGERY    . HERNIA REPAIR    . TUBAL  LIGATION       OB History   No obstetric history on file.     Family History  Problem Relation Age of Onset  . Hyperlipidemia Mother   . Hypertension Mother   . Mental illness Mother        bipolar/manic  . Hyperlipidemia Father   . Mental illness Father        bipolar  . Alcohol abuse Father   . Cancer Father        lung cancer  . Hyperlipidemia Sister   . Mental illness Sister        bipolar  . Diabetes Sister   . Kidney disease Sister   . Hyperlipidemia Brother   . Mental illness Brother        bipolar/manic  . Cancer Brother        lung cancer  . Mental illness Brother        bipolar/manic  . Hyperlipidemia Brother   . Mental illness Daughter        bipolar  . Hypertension Daughter   . Scoliosis Daughter   . Diabetes Daughter     Social History   Tobacco Use  . Smoking status: Never Smoker  . Smokeless tobacco: Never Used  Substance Use Topics  . Alcohol use: No    Alcohol/week: 0.0 standard drinks  . Drug use: No    Home  Medications Prior to Admission medications   Medication Sig Start Date End Date Taking? Authorizing Provider  amLODipine (NORVASC) 10 MG tablet Take 1 Tablet by mouth 2 times a day 06/11/16   Jaynee Eagles, PA-C  aspirin 81 MG tablet Take 81 mg by mouth daily.    [provider]  cyclobenzaprine (FLEXERIL) 10 MG tablet Take 1 tablet (10 mg total) by mouth 3 (three) times daily as needed for muscle spasms. 01/18/16   Elby Beck, FNP  diazepam (VALIUM) 10 MG tablet Take 1 tablet (10 mg total) by mouth at bedtime as needed for anxiety. 05/24/16   Jaynee Eagles, PA-C  lisinopril-hydrochlorothiazide (PRINZIDE,ZESTORETIC) 20-12.5 MG tablet Take 1 tablet by mouth daily.  06/21/15   [provider]  meloxicam (MOBIC) 7.5 MG tablet Take 1 tablet (7.5 mg total) by mouth daily. Patient not taking: Reported on 05/24/2016 01/18/16   Elby Beck, FNP  metoprolol (LOPRESSOR) 100 MG tablet TAKE ONE TABLET BY MOUTH TWICE DAILY  03/07/16   Harrison Mons, PA  naproxen (NAPROSYN) 500 MG tablet Take 500 mg by mouth 2 (two) times daily. Reported on 02/27/2016 11/04/15   [provider]  Omega-3 Fatty Acids (OMEGA-3 FISH OIL PO) Take 1 capsule by mouth daily.     [provider]  Probiotic Product (PROBIOTIC DAILY PO) Take 1 capsule by mouth daily.     [provider]  QUEtiapine (SEROQUEL) 200 MG tablet Take 1 tablet (200 mg total) by mouth at bedtime. 05/24/16   Jaynee Eagles, PA-C  spironolactone (ALDACTONE) 25 MG tablet Take 25 mg by mouth daily. Reported on 01/18/2016    [provider]    Allergies    Codeine and Tramadol  Review of Systems   Review of Systems  Constitutional: Negative for chills and fever.  Respiratory: Negative for shortness of breath.   Cardiovascular: Negative for chest pain.  Gastrointestinal: Negative for abdominal pain, nausea and vomiting.  Genitourinary: Negative for dysuria.  Musculoskeletal: Negative for arthralgias.  Skin: Positive for wound. Negative for color change.       Area of tenderness and purulence to the left breast  Neurological: Negative for dizziness, weakness, numbness and headaches.  Hematological: Negative for adenopathy.    Physical Exam Updated Vital Signs BP 106/88 (BP Location: Right Arm)   Pulse 85   Temp 98.5 F (36.9 C) (Oral)   Resp 18   Ht 5\' 3"  (1.6 m)   Wt 72.6 kg   SpO2 98%   BMI 28.34 kg/m   Physical Exam Vitals and nursing note reviewed.  Constitutional:      General: She is not in acute distress.    Appearance: Normal appearance. She is not ill-appearing.  Cardiovascular:     Rate and Rhythm: Normal rate and regular rhythm.     Pulses: Normal pulses.  Pulmonary:     Breath sounds: Normal breath sounds.  Chest:     Chest wall: No tenderness.  Abdominal:     General: There is no distension.     Palpations: Abdomen is soft.     Tenderness: There is no abdominal tenderness.  Musculoskeletal:         General: Normal range of motion.     Right lower leg: No edema.     Left lower leg: No edema.  Skin:    General: Skin is warm.     Capillary Refill: Capillary refill takes less than 2 seconds.     Comments: 3 cm open lesion  of the left medial breast with central purulent material noted.  No significant surrounding erythema, no induration or drainage.  No involvement of the nipple or areola.  Neurological:     General: No focal deficit present.     Mental Status: She is alert.     Sensory: No sensory deficit.     Motor: No weakness.     ED Results / Procedures / Treatments   Labs (all labs ordered are listed, but only abnormal results are displayed) Labs Reviewed - No data to display  EKG None  Radiology No results found.  Procedures Procedures (including critical care time)  Medications Ordered in ED Medications - No data to display  ED Course  I have reviewed the triage vital signs and the nursing notes.  Pertinent labs & imaging results that were available during my care of the patient were reviewed by me and considered in my medical decision making (see chart for details).    MDM Rules/Calculators/A&P                      Patient here with possible insect bite.  No definite abscess, no induration or surrounding erythema to suggest cellulitis.  No indication for I&D at this time.  No concern for mastitis.  Patient well-appearing.  I discussed wound care and to avoid using peroxide.  Patient verbalized understanding.  Prescription for antibiotics, she agrees to close outpatient follow-up.  Return precautions discussed.   Final Clinical Impression(s) / ED Diagnoses Final diagnoses:  Bug bite with infection, initial encounter    Rx / DC Orders ED Discharge Orders    None       Rosey Bath 01/05/20 2353    Raeford Razor, MD 01/06/20 [phone]

## 2020-02-02 ENCOUNTER — Ambulatory Visit: Payer: Self-pay | Attending: Internal Medicine

## 2020-02-02 DIAGNOSIS — Z23 Encounter for immunization: Secondary | ICD-10-CM

## 2020-02-02 NOTE — Progress Notes (Signed)
   Covid-19 Vaccination Clinic  Name:  Catherine Farrell    MRN: [id number] DOB: Jan 16, 1956  02/02/2020  [name] was observed post Covid-19 immunization for 30 minutes based on pre-vaccination screening without incident. She was provided with Vaccine Information Sheet and instruction to access the V-Safe system.   [name] was instructed to call 911 with any severe reactions post vaccine: Marland Kitchen Difficulty breathing  . Swelling of face and throat  . A fast heartbeat  . A bad rash all over body  . Dizziness and weakness   Immunizations Administered    Name Date Dose VIS Date Route   Moderna COVID-19 Vaccine 02/02/2020 11:37 AM 0.5 mL 09/2019 Intramuscular   Manufacturer: Moderna   Lot: [id number]   NDC: 30160-109-32

## 2020-09-10 ENCOUNTER — Emergency Department (HOSPITAL_COMMUNITY): Payer: Self-pay

## 2020-09-10 ENCOUNTER — Emergency Department (HOSPITAL_COMMUNITY)
Admission: EM | Admit: 2020-09-10 | Discharge: 2020-09-10 | Disposition: A | Discharge status: Home / Self Care | Payer: Self-pay | Attending: Emergency Medicine | Admitting: Emergency Medicine

## 2020-09-10 ENCOUNTER — Other Ambulatory Visit: Payer: Self-pay

## 2020-09-10 ENCOUNTER — Encounter (HOSPITAL_COMMUNITY): Payer: Self-pay | Admitting: Emergency Medicine

## 2020-09-10 DIAGNOSIS — I10 Essential (primary) hypertension: Secondary | ICD-10-CM | POA: Insufficient documentation

## 2020-09-10 DIAGNOSIS — N939 Abnormal uterine and vaginal bleeding, unspecified: Secondary | ICD-10-CM | POA: Insufficient documentation

## 2020-09-10 DIAGNOSIS — Z90710 Acquired absence of both cervix and uterus: Secondary | ICD-10-CM | POA: Insufficient documentation

## 2020-09-10 DIAGNOSIS — Z7982 Long term (current) use of aspirin: Secondary | ICD-10-CM | POA: Insufficient documentation

## 2020-09-10 DIAGNOSIS — Z79899 Other long term (current) drug therapy: Secondary | ICD-10-CM | POA: Insufficient documentation

## 2020-09-10 DIAGNOSIS — R9389 Abnormal findings on diagnostic imaging of other specified body structures: Secondary | ICD-10-CM

## 2020-09-10 DIAGNOSIS — R0789 Other chest pain: Secondary | ICD-10-CM | POA: Insufficient documentation

## 2020-09-10 DIAGNOSIS — R079 Chest pain, unspecified: Secondary | ICD-10-CM

## 2020-09-10 LAB — TROPONIN I (HIGH SENSITIVITY): Troponin I (High Sensitivity): 4 ng/L (ref <18)

## 2020-09-10 MED ORDER — PANTOPRAZOLE SODIUM 40 MG PO TBEC: 40.0000 mg | DELAYED_RELEASE_TABLET | Freq: Every day | ORAL | 1 refills | Status: AC

## 2020-09-10 NOTE — Discharge Instructions (Addendum)
Your testing today was unremarkable, shows no signs of heart attack, your x-ray is unremarkable, you likely have acid reflux Stop taking Prilosec, rather switch over to Protonix which I have prescribed, add Pepcid twice a day for 2 weeks then switch to once a day. Please see the gynecologist of your choice or the doctor listed above for further evaluation of vaginal bleeding.  Your exam today was rather unremarkable and thankfully showed no signs of tumors, cancers or other problems that may cause bleeding ER for worsening symptoms or any other concern  Your chest x-ray shows that you have a slightly abnormal chest x-ray, the left lung at the bottom has a little bit of abnormal appearance, this is not pneumonia, it is not consistent with a cancer, it is probably nothing more than atelectasis which is when the bottom of the lung is not expanded because of a deep breath.  Please have your doctor repeat the x-ray within 2 weeks.

## 2020-09-10 NOTE — ED Triage Notes (Signed)
Pt c/o of intermittent vaginal bleeding with pelvic pain x 3 months as well as cp x 1 year

## 2020-09-10 NOTE — ED Provider Notes (Signed)
[facility] EMERGENCY DEPARTMENT Provider Note   CSN: [id number] Arrival date & time: 09/10/20  7628     History Chief Complaint  Patient presents with  . Vaginal Bleeding  . Chest Pain    Hayleen Clinkscales is a 64 y.o. female.  HPI   This patient is a 64 year old female, she has a history of bipolar disorder, chronic hepatitis C, she has a history of acid reflux and comes in today with 2 different complaints  1.  The patient states that she has had chest discomfort radiating from her chest up to her throat, she feels like she regurgitates her food, she was on Prilosec in the past feeling like that was helpful but recently this does not seem to help and she has been weeks without any relief of her pain which is poorly described but constant in that sternal location.  It gets worse with eating, gets worse with laying down at night and is not associated with coughing or shortness of breath or leg swelling.  2.  The patient also complains of having some vaginal bleeding which occurs when she has sexual intercourse, she last had intercourse approximately 4 months ago, she states it is not aggressive sex, she states it is "old school sex".  She has not had any bleeding in 4 months but is concerned because of the bleeding that she had.  She does not see a gynecologist.  She is already had a hysterectomy according to her report and does not take any anticoagulants  Past Medical History:  Diagnosis Date  . Bipolar 1 disorder (HCC)   . Blood transfusion without reported diagnosis   . Cholelithiasis 10/03/2015   GB US 01/11/2014 was NEGATIVE for gallstones, wall thickening or pericholecystic fluid.   . Depression   . Hepatitis C   . Hypertension   . Neuromuscular disorder (HCC)   . PNA (pneumonia) 08/2009   viral  . Restless leg   . Shingles 06/2013   was outside acute window for treatment, started on gabapentin    Patient Active Problem List   Diagnosis Date Noted  . 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 CAP (community acquired pneumonia) 11/08/2015  . Acute respiratory failure with hypoxia (HCC) 11/08/2015  . Abnormal thyroid function test 11/02/2015  . Hyperlipidemia 11/02/2015  . History of tobacco use 11/02/2015  . Myalgia and myositis 11/02/2015  . Bipolar 1 disorder (HCC) 09/22/2015  . HTN (hypertension) 09/22/2015  . RLS (restless legs syndrome) 09/22/2015  . Chronic hepatitis C (HCC) 09/22/2015  . 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 ABDOMINAL HYSTERECTOMY     partial hysterectomy due to fibroids  . BLADDER SURGERY    . CHOLECYSTECTOMY    . COLON SURGERY    . HERNIA REPAIR    . TUBAL LIGATION       OB History   No obstetric history on file.     Family History  Problem Relation Age of Onset  . Hyperlipidemia Mother   . Hypertension Mother   . Mental illness Mother        bipolar/manic  . Hyperlipidemia Father   . Mental illness Father        bipolar  . Alcohol abuse Father   . Cancer Father        lung cancer  . Hyperlipidemia Sister   . Mental illness Sister        bipolar  . Diabetes Sister   .  Kidney disease Sister   . Hyperlipidemia Brother   . Mental illness Brother        bipolar/manic  . Cancer Brother        lung cancer  . Mental illness Brother        bipolar/manic  . Hyperlipidemia Brother   . Mental illness Daughter        bipolar  . Hypertension Daughter   . Scoliosis Daughter   . Diabetes Daughter     Social History   Tobacco Use  . Smoking status: Never Smoker  . Smokeless tobacco: Never Used  Substance Use Topics  . Alcohol use: No    Alcohol/week: 0.0 standard drinks  . Drug use: No    Home Medications Prior to Admission medications   Medication Sig Start Date End Date Taking? Authorizing Provider  amLODipine (NORVASC) 10 MG tablet Take 1 Tablet by mouth 2 times a day 06/11/16   Wallis Bamberg, PA-C  aspirin 81 MG tablet  Take 81 mg by mouth daily.    [provider]  cyclobenzaprine (FLEXERIL) 10 MG tablet Take 1 tablet (10 mg total) by mouth 3 (three) times daily as needed for muscle spasms. 01/18/16   Emi Belfast, FNP  diazepam (VALIUM) 10 MG tablet Take 1 tablet (10 mg total) by mouth at bedtime as needed for anxiety. 05/24/16   Wallis Bamberg, PA-C  doxycycline (VIBRAMYCIN) 100 MG capsule Take 1 capsule (100 mg total) by mouth 2 (two) times daily. 01/05/20   Triplett, Tammy, PA-C  lisinopril-hydrochlorothiazide (PRINZIDE,ZESTORETIC) 20-12.5 MG tablet Take 1 tablet by mouth daily.  06/21/15   [provider]  meloxicam (MOBIC) 7.5 MG tablet Take 1 tablet (7.5 mg total) by mouth daily. Patient not taking: Reported on 05/24/2016 01/18/16   Emi Belfast, FNP  metoprolol (LOPRESSOR) 100 MG tablet TAKE ONE TABLET BY MOUTH TWICE DAILY 03/07/16   Porfirio Oar, PA  naproxen (NAPROSYN) 500 MG tablet Take 500 mg by mouth 2 (two) times daily. Reported on 02/27/2016 11/04/15   [provider]  Omega-3 Fatty Acids (OMEGA-3 FISH OIL PO) Take 1 capsule by mouth daily.     [provider]  pantoprazole (PROTONIX) 40 MG tablet Take 1 tablet (40 mg total) by mouth daily. 09/10/20   Eber Hong, MD  Probiotic Product (PROBIOTIC DAILY PO) Take 1 capsule by mouth daily.     [provider]  QUEtiapine (SEROQUEL) 200 MG tablet Take 1 tablet (200 mg total) by mouth at bedtime. 05/24/16   Wallis Bamberg, PA-C  spironolactone (ALDACTONE) 25 MG tablet Take 25 mg by mouth daily. Reported on 01/18/2016    [provider]    Allergies    Codeine and Tramadol  Review of Systems   Review of Systems  All other systems reviewed and are negative.   Physical Exam Updated Vital Signs BP (!) 149/83   Pulse 68   Temp 97.9 F (36.6 C) (Oral)   Resp 18   Ht 1.6 m (5\' 3" )   Wt 72.6 kg   SpO2 93%   BMI 28.34 kg/m   Physical Exam Vitals and nursing note reviewed.  Constitutional:       General: She is not in acute distress.    Appearance: She is well-developed.  HENT:     Head: Normocephalic and atraumatic.     Mouth/Throat:     Pharynx: No oropharyngeal exudate.  Eyes:     General: No scleral icterus.  Right eye: No discharge.        Left eye: No discharge.     Conjunctiva/sclera: Conjunctivae normal.     Pupils: Pupils are equal, round, and reactive to light.  Neck:     Thyroid: No thyromegaly.     Vascular: No JVD.  Cardiovascular:     Rate and Rhythm: Normal rate and regular rhythm.     Heart sounds: Normal heart sounds. No murmur heard.  No friction rub. No gallop.   Pulmonary:     Effort: Pulmonary effort is normal. No respiratory distress.     Breath sounds: Normal breath sounds. No wheezing or rales.  Abdominal:     General: Bowel sounds are normal. There is no distension.     Palpations: Abdomen is soft. There is no mass.     Tenderness: There is no abdominal tenderness.  Genitourinary:    Comments: Mindy, RN, present for entire exam.  Prior to exam being performed the patient was given risks benefits and alternatives, she request to go forward with both a speculum and digital exam.  Patient has normal-appearing external genitalia, internal exam without any signs of bleeding, masses, tumors, there is no friable tissue, no foreign body, no discharge, no foul smell.  Digital exam has no tenderness in the adnexa or in the walls of the vaginal vault.  Unremarkable exam. Chaperone present,  Musculoskeletal:        General: No tenderness. Normal range of motion.     Cervical back: Normal range of motion and neck supple.  Lymphadenopathy:     Cervical: No cervical adenopathy.  Skin:    General: Skin is warm and dry.     Findings: No erythema or rash.  Neurological:     Mental Status: She is alert.     Coordination: Coordination normal.     Comments: Clear speech, normal strength in all 4 ext - no facial droop.  Psychiatric:        Behavior:  Behavior normal.     ED Results / Procedures / Treatments   Labs (all labs ordered are listed, but only abnormal results are displayed) Labs Reviewed  TROPONIN I (HIGH SENSITIVITY)    EKG EKG Interpretation  Date/Time:  Saturday September 10 2020 10:24:46 EST Ventricular Rate:  76 PR Interval:    QRS Duration: 99 QT Interval:  409 QTC Calculation: 460 R Axis:   -24 Text Interpretation: Sinus rhythm Left ventricular hypertrophy Lateral infarct, age indeterminate Since last tracing LVH now seen Confirmed by Eber HongMiller, Jayah Balthazar ([id number]) on 09/10/2020 10:29:46 AM   Radiology DG Chest Port 1 View  Result Date: 09/10/2020 CLINICAL DATA:  Chest pain. EXAM: PORTABLE CHEST 1 VIEW COMPARISON:  February 01, 2016 FINDINGS: The heart size borderline. The hila and mediastinum are unchanged. The right lung is clear. There is opacity in the left base. No other acute abnormalities. No pneumothorax. IMPRESSION: Mild opacity in left lung base may represent atelectasis or developing infiltrate. Recommend clinical correlation and attention on short-term follow-up. No other acute abnormalities. Electronically Signed   By: Gerome Samavid  Williams III M.D   On: 09/10/2020 11:34    Procedures Procedures (including critical care time)  Medications Ordered in ED Medications - No data to display  ED Course  I have reviewed the triage vital signs and the nursing notes.  Pertinent labs & imaging results that were available during my care of the patient were reviewed by me and considered in my medical decision making (see chart for details).  Clinical Course as of Sep 10 1248  Sat Sep 10, 2020  1137 The patient has no abnormal lung findings, oxygen is normal, heart rate is normal, no coughing, no shortness of breath.  I doubt the chest x-ray shows pneumonia, this is likely atelectasis, she can follow-up for repeat chest x-ray   [BM]    Clinical Course User Index [BM] Eber Hong, MD   MDM Rules/Calculators/A&P                            The patient has some ongoing chest discomfort, it does not sound like it is necessarily exertional however the patient will need a troponin and a chest x-ray.  Her EKG is nonischemic.  The vaginal exam was unremarkable and showed no bleeding, she will need referral to gynecology.  The patient is agreeable, this is likely chest pain related to some gastrointestinal problems, possibly acid reflux.  Will change to Protonix and add Pepcid.  The patient only has vaginal bleeding with intercourse, she will abstain until she follows up with gynecology.  Patient informed of abnormal x-ray, troponin negative, stable for discharge  Final Clinical Impression(s) / ED Diagnoses Final diagnoses:  Chest pain, central  Vaginal bleeding  Abnormal x-ray    Rx / DC Orders ED Discharge Orders         Ordered    pantoprazole (PROTONIX) 40 MG tablet  Daily        09/10/20 1059           Eber Hong, MD 09/10/20 1249

## 2023-06-07 ENCOUNTER — Other Ambulatory Visit (HOSPITAL_BASED_OUTPATIENT_CLINIC_OR_DEPARTMENT_OTHER): Payer: Self-pay

## 2023-06-11 ENCOUNTER — Other Ambulatory Visit (HOSPITAL_BASED_OUTPATIENT_CLINIC_OR_DEPARTMENT_OTHER): Payer: Self-pay

## 2023-06-11 MED ORDER — MOUNJARO 2.5 MG/0.5ML ~~LOC~~ SOAJ
2.5000 mg | SUBCUTANEOUS | 0 refills | Status: DC
  Filled 2023-06-11: qty 2, 28d supply, fill #0

## 2023-06-12 ENCOUNTER — Other Ambulatory Visit: Payer: Self-pay

## 2023-06-13 ENCOUNTER — Other Ambulatory Visit (HOSPITAL_BASED_OUTPATIENT_CLINIC_OR_DEPARTMENT_OTHER): Payer: Self-pay

## 2023-07-04 ENCOUNTER — Other Ambulatory Visit (HOSPITAL_BASED_OUTPATIENT_CLINIC_OR_DEPARTMENT_OTHER): Payer: Self-pay

## 2023-07-04 MED ORDER — MOUNJARO 2.5 MG/0.5ML ~~LOC~~ SOAJ
SUBCUTANEOUS | 0 refills | Status: DC
  Filled 2023-07-04: qty 2, 28d supply, fill #0

## 2023-07-09 ENCOUNTER — Other Ambulatory Visit (HOSPITAL_BASED_OUTPATIENT_CLINIC_OR_DEPARTMENT_OTHER): Payer: Self-pay
